# Patient Record
Sex: Female | Born: 1970 | Race: White | Hispanic: No | Marital: Married | Smoking: Never smoker
Health system: Southern US, Community
[De-identification: ages and names within clinical notes are randomized; demographics above are authoritative.]

## PROBLEM LIST (undated history)

## (undated) DIAGNOSIS — K929 Disease of digestive system, unspecified: Secondary | ICD-10-CM

## (undated) DIAGNOSIS — R928 Other abnormal and inconclusive findings on diagnostic imaging of breast: Secondary | ICD-10-CM

## (undated) DIAGNOSIS — I10 Essential (primary) hypertension: Secondary | ICD-10-CM

## (undated) DIAGNOSIS — K219 Gastro-esophageal reflux disease without esophagitis: Secondary | ICD-10-CM

## (undated) DIAGNOSIS — M722 Plantar fascial fibromatosis: Secondary | ICD-10-CM

## (undated) DIAGNOSIS — F419 Anxiety disorder, unspecified: Secondary | ICD-10-CM

## (undated) DIAGNOSIS — T7840XA Allergy, unspecified, initial encounter: Secondary | ICD-10-CM

## (undated) HISTORY — DX: Disease of digestive system, unspecified: K92.9

## (undated) HISTORY — PX: APPENDECTOMY: SHX54

## (undated) HISTORY — DX: Anxiety disorder, unspecified: F41.9

## (undated) HISTORY — DX: Essential (primary) hypertension: I10

## (undated) HISTORY — DX: Other abnormal and inconclusive findings on diagnostic imaging of breast: R92.8

## (undated) HISTORY — DX: Gastro-esophageal reflux disease without esophagitis: K21.9

## (undated) HISTORY — DX: Allergy, unspecified, initial encounter: T78.40XA

## (undated) HISTORY — PX: INTRAUTERINE DEVICE (IUD) INSERTION: SHX5877

## (undated) HISTORY — DX: Plantar fascial fibromatosis: M72.2

---

## 2006-01-11 ENCOUNTER — Ambulatory Visit: Payer: Self-pay | Admitting: Internal Medicine

## 2007-12-04 ENCOUNTER — Ambulatory Visit: Payer: Self-pay | Admitting: General Surgery

## 2007-12-13 ENCOUNTER — Ambulatory Visit: Payer: Self-pay | Admitting: General Surgery

## 2008-04-02 ENCOUNTER — Ambulatory Visit: Payer: Self-pay | Admitting: General Surgery

## 2008-12-04 ENCOUNTER — Ambulatory Visit: Payer: Self-pay | Admitting: General Surgery

## 2009-04-25 HISTORY — PX: FOOT SURGERY: SHX648

## 2009-12-07 ENCOUNTER — Ambulatory Visit: Payer: Self-pay | Admitting: General Surgery

## 2009-12-31 ENCOUNTER — Ambulatory Visit: Payer: Self-pay | Admitting: Podiatry

## 2010-12-09 ENCOUNTER — Ambulatory Visit: Payer: Self-pay | Admitting: General Surgery

## 2011-12-12 ENCOUNTER — Ambulatory Visit: Payer: Self-pay | Admitting: Obstetrics and Gynecology

## 2012-04-25 HISTORY — PX: CHOLECYSTECTOMY: SHX55

## 2012-10-16 ENCOUNTER — Ambulatory Visit: Payer: Self-pay | Admitting: Surgery

## 2012-10-17 LAB — PATHOLOGY REPORT

## 2012-12-13 ENCOUNTER — Ambulatory Visit: Payer: Self-pay | Admitting: Obstetrics and Gynecology

## 2014-01-06 ENCOUNTER — Ambulatory Visit: Payer: Self-pay | Admitting: Obstetrics and Gynecology

## 2014-08-15 NOTE — Op Note (Signed)
PATIENT NAME:  Mary Miranda, Mary Miranda MR#:  [id number] DATE OF BIRTH:  Jun 18, 1970  DATE OF PROCEDURE:  10/16/2012  PREOPERATIVE DIAGNOSIS: Chronic cholecystitis.   POSTOPERATIVE DIAGNOSIS: Chronic cholecystitis, cholelithiasis.   PROCEDURE: Laparoscopic cholecystectomy.   SURGEON: Renda RollsWilton Smith, MD  ANESTHESIA: General.   INDICATIONS: This 44 year old female has a history of epigastric pains. She had had ultrasound at another facility which reported a 2.4 cm polyp in the gallbladder and surgery was recommended for definitive treatment.   DESCRIPTION OF PROCEDURE: The patient was placed on the operating table in the supine position under general anesthesia. The abdomen was prepared with ChloraPrep and draped in a sterile manner. I noted her old scar in the right paramedian area.  An incision was made in the inferior aspect of the umbilicus and carried down to the deep fascia which was grasped with laryngeal hook and elevated. A Veress needle was inserted, aspirated and irrigated with a saline solution. Next, the peritoneal cavity was inflated with carbon dioxide. The Veress needle was removed. A 10 mm cannula was inserted. The 10 mm, 0 degree laparoscope was inserted to view the peritoneal cavity. Another incision was made in the epigastrium just to the right of the midline to introduce an 11 mm cannula. Another incision was made in the right upper quadrant to insert a 5 mm cannula and another just about 2 inches inferior to that to insert another 5 mm cannula.   A number of adhesions were noted between the omentum and the right anterior abdominal wall which appeared to be related to her previous appendicitis. Also, a number of adhesions in the right lower quadrant. The liver appeared normal. The gallbladder was retracted towards the right shoulder. There were multiple adhesions which were taken down from the gallbladder. The infundibulum was retracted inferiorly and laterally. The porta hepatis was  demonstrated. The cystic duct was dissected free from surrounding structures. The cystic artery was dissected free from surrounding structures. The neck of the gallbladder was mobilized with incision of the visceral peritoneum. A critical view of safety was demonstrated. Next, the cystic artery was controlled with double endoclips and divided which allowed better traction on the cystic duct. An Endo Clip was placed across the cystic duct adjacent to the neck of the gallbladder. An incision was made in the cystic duct to introduce a Reddick catheter. However, the catheter would not thread beyond about 6 mm and the balloon would not stay in.  Therefore, cholangiogram was not done. The Reddick catheter was removed. The cystic duct was doubly ligated with endoclips and divided. The gallbladder was dissected free from the liver with hook and cautery. There was very minimal bleeding. The site was irrigated with heparinized saline solution and aspirated. Hemostasis was subsequently intact. The gallbladder was completely separated and delivered out through the infraumbilical incision, opened and suctioned. There was a scant amount of sludge within the gallbladder and also identified several tiny approximately 1 mm stones adherent to the gallbladder wall.  The gallbladder was submitted in formalin for routine pathology. The right upper quadrant was further inspected. Hemostasis was intact. The cannulas were removed. Carbon dioxide was allowed to escape from the peritoneal cavity. Skin incisions were closed with interrupted 5-0 chromic subcuticular sutures, benzoin and Steri-Strips. Dressings were applied with paper tape. The patient tolerated surgery satisfactorily and was prepared for transfer to the recovery room. ____________________________ Shela CommonsJ. Renda RollsWilton Smith, MD jws:sb D: 10/16/2012 09:03:23 ET T: 10/16/2012 10:29:43 ET JOB#: [id number]  cc: Adella HareJ. Wilton Smith, MD, <  Dictator> Adella Hare MD ELECTRONICALLY SIGNED  10/17/2012 18:13

## 2014-09-25 ENCOUNTER — Other Ambulatory Visit: Payer: Self-pay | Admitting: Advanced Practice Midwife

## 2014-09-25 DIAGNOSIS — Z1231 Encounter for screening mammogram for malignant neoplasm of breast: Secondary | ICD-10-CM

## 2015-01-08 ENCOUNTER — Ambulatory Visit
Admission: RE | Admit: 2015-01-08 | Discharge: 2015-01-08 | Disposition: A | Discharge status: Home / Self Care | Payer: BLUE CROSS/BLUE SHIELD | Source: Ambulatory Visit | Attending: Advanced Practice Midwife | Admitting: Advanced Practice Midwife

## 2015-01-08 DIAGNOSIS — Z1231 Encounter for screening mammogram for malignant neoplasm of breast: Secondary | ICD-10-CM | POA: Insufficient documentation

## 2015-12-31 ENCOUNTER — Other Ambulatory Visit: Payer: Self-pay | Admitting: Advanced Practice Midwife

## 2015-12-31 DIAGNOSIS — Z1231 Encounter for screening mammogram for malignant neoplasm of breast: Secondary | ICD-10-CM

## 2016-01-12 ENCOUNTER — Ambulatory Visit: Payer: BLUE CROSS/BLUE SHIELD

## 2016-02-03 ENCOUNTER — Ambulatory Visit
Admission: RE | Admit: 2016-02-03 | Discharge: 2016-02-03 | Disposition: A | Discharge status: Home / Self Care | Payer: BLUE CROSS/BLUE SHIELD | Source: Ambulatory Visit | Attending: Advanced Practice Midwife | Admitting: Advanced Practice Midwife

## 2016-02-03 DIAGNOSIS — Z1231 Encounter for screening mammogram for malignant neoplasm of breast: Secondary | ICD-10-CM | POA: Insufficient documentation

## 2016-02-05 ENCOUNTER — Other Ambulatory Visit: Payer: Self-pay | Admitting: Advanced Practice Midwife

## 2016-02-05 DIAGNOSIS — N6320 Unspecified lump in the left breast, unspecified quadrant: Secondary | ICD-10-CM

## 2016-02-09 ENCOUNTER — Ambulatory Visit
Admission: RE | Admit: 2016-02-09 | Discharge: 2016-02-09 | Disposition: A | Discharge status: Home / Self Care | Payer: BLUE CROSS/BLUE SHIELD | Source: Ambulatory Visit | Attending: Advanced Practice Midwife | Admitting: Advanced Practice Midwife

## 2016-02-09 DIAGNOSIS — N6320 Unspecified lump in the left breast, unspecified quadrant: Secondary | ICD-10-CM | POA: Insufficient documentation

## 2016-06-22 ENCOUNTER — Telehealth: Payer: Self-pay | Admitting: Advanced Practice Midwife

## 2016-06-22 NOTE — Telephone Encounter (Signed)
Patient called in stating she recieved a bill from [facility] and is being charged for labs that came with annual. Patient called [facility] and they said it was a coding issue on our side. Please contact the patient as she has had the run around about this bill.

## 2016-06-23 NOTE — Telephone Encounter (Signed)
Viewed chart/coding for labs and printed Note in [facility]. It appears that the MAP ALL button was used when labs were processed which could have caused incorrect mapping of diagnosis to specific labs. [facility] visit note printed and proper diagnosis written beside each lab ordered. Will contact [facility] for resubmission with proper codes to each lab.

## 2016-06-24 NOTE — Telephone Encounter (Signed)
Spoke with pt. Notified that I will contact [facility] to give correct diagnosis codes for each lab and have them resubmit.

## 2016-06-30 DIAGNOSIS — L0293 Carbuncle, unspecified: Secondary | ICD-10-CM | POA: Diagnosis not present

## 2016-08-18 DIAGNOSIS — L02419 Cutaneous abscess of limb, unspecified: Secondary | ICD-10-CM | POA: Diagnosis not present

## 2016-08-29 DIAGNOSIS — I871 Compression of vein: Secondary | ICD-10-CM | POA: Diagnosis not present

## 2016-09-06 ENCOUNTER — Encounter (INDEPENDENT_AMBULATORY_CARE_PROVIDER_SITE_OTHER): Payer: Self-pay | Admitting: Vascular Surgery

## 2016-09-06 ENCOUNTER — Ambulatory Visit (INDEPENDENT_AMBULATORY_CARE_PROVIDER_SITE_OTHER): Payer: BLUE CROSS/BLUE SHIELD | Admitting: Vascular Surgery

## 2016-09-06 VITALS — BP 157/97 | HR 82 | Resp 16 | Ht 65.5 in | Wt 169.0 lb

## 2016-09-06 DIAGNOSIS — M79609 Pain in unspecified limb: Secondary | ICD-10-CM | POA: Insufficient documentation

## 2016-09-06 DIAGNOSIS — I808 Phlebitis and thrombophlebitis of other sites: Secondary | ICD-10-CM | POA: Insufficient documentation

## 2016-09-06 DIAGNOSIS — M79602 Pain in left arm: Secondary | ICD-10-CM

## 2016-09-06 NOTE — Assessment & Plan Note (Signed)
The patient has a painful cord on the medial aspect of the left upper extremity correlating with a basilic vein thrombophlebitis. We had a discussion today regarding the generally benign nature of these conditions. The area may take many more weeks or even months to heal completely. The risk of embolization or propagation is very small at this point. Would recommend an aspirin daily. I would recommend warm compresses for symptomatic relief. Given the long-standing nature of the lesion, ultrasound is probably not really that helpful at this point. If she developed significant arm swelling consideration for a compression sleeve should be given as well.

## 2016-09-06 NOTE — Progress Notes (Signed)
Patient ID: Mary Miranda, female   DOB: 1970-06-11, 46 y.o.   MRN: [id number]  Chief Complaint  Patient presents with  . New Evaluation    Vein obstruction    HPI Mary Miranda is a 46 y.o. female.  I am asked to see the patient by Dr. Dan Humphreys for evaluation of a persistent painful cord in her left arm.  The patient reports about 2 months ago she developed an axillary abscess on the left. This was drained. Following this she developed a firm cord on the inner aspect of her left upper arm. This has slowly gone towards the antecubital fossa over time. Warm compresses have helped this. There is occasional swelling. The pain is most severe in the evening when she has been using her arm for much of the day. She denies any chest pain or shortness of breath. She did not have an IV in that arm.   Past Medical History:  Diagnosis Date  . Allergy   . Anxiety     Past Surgical History:  Procedure Laterality Date  . APPENDECTOMY    . CHOLECYSTECTOMY      Family History  Problem Relation Age of Onset  . Breast cancer Neg Hx    No bleeding or clotting disorders  Social History Social History  Substance Use Topics  . Smoking status: Never Smoker  . Smokeless tobacco: Never Used  . Alcohol use Yes     Comment: ocassionally     Allergies  Allergen Reactions  . Amoxicillin-Pot Clavulanate     Other reaction(s): Other (See Comments) GI upset  . Codeine Itching    Current Outpatient Prescriptions  Medication Sig Dispense Refill  . cetirizine (ZYRTEC) 10 MG tablet Take by mouth.    . Cholecalciferol (VITAMIN D3) 1000 units CAPS Take by mouth.    . fluticasone (FLONASE) 50 MCG/ACT nasal spray PLACE 2 SPRAYS INTO BOTH NOSTRILS ONCE DAILY.  3  . MAGNESIUM PO Take by mouth.    . meloxicam (MOBIC) 7.5 MG tablet Take by mouth.    Marland Kitchen omeprazole (PRILOSEC) 20 MG capsule   2  . zolpidem (AMBIEN) 5 MG tablet TAKE 1 TABLET BY MOUTH NIGHTLY AS NEEDED FOR SLEEP     No current  facility-administered medications for this visit.       REVIEW OF SYSTEMS (Negative unless checked)  Constitutional: [] Weight loss  [] Fever  [] Chills Cardiac: [] Chest pain   [] Chest pressure   [] Palpitations   [] Shortness of breath when laying flat   [] Shortness of breath at rest   [] Shortness of breath with exertion. Vascular:  [] Pain in legs with walking   [] Pain in legs at rest   [] Pain in legs when laying flat   [] Claudication   [] Pain in feet when walking  [] Pain in feet at rest  [] Pain in feet when laying flat   [] History of DVT   [] Phlebitis   [] Swelling in legs   [] Varicose veins   [] Non-healing ulcers Pulmonary:   [] Uses home oxygen   [] Productive cough   [] Hemoptysis   [] Wheeze  [] COPD   [] Asthma Neurologic:  [] Dizziness  [] Blackouts   [] Seizures   [] History of stroke   [] History of TIA  [] Aphasia   [] Temporary blindness   [] Dysphagia   [] Weakness or numbness in arms   [] Weakness or numbness in legs Musculoskeletal:  [] Arthritis   [] Joint swelling   [] Joint pain   [] Low back pain Hematologic:  [] Easy bruising  [] Easy bleeding   [] Hypercoagulable  state   [] Anemic  [] Hepatitis Gastrointestinal:  [] Blood in stool   [] Vomiting blood  [] Gastroesophageal reflux/heartburn   [] Abdominal pain Genitourinary:  [] Chronic kidney disease   [] Difficult urination  [] Frequent urination  [] Burning with urination   [] Hematuria Skin:  [] Rashes   [] Ulcers   [] Wounds Psychological:  [x] History of anxiety   []  History of major depression.    Physical Exam BP (!) 157/97 (BP Location: Right Arm)   Pulse 82   Resp 16   Ht 5' 5.5" (1.664 m)   Wt 169 lb (76.7 kg)   BMI 27.70 kg/m  Gen:  WD/WN, NAD Head: [place]/AT, No temporalis wasting. Ear/Nose/Throat: Hearing grossly intact, nares w/o erythema or drainage, oropharynx w/o Erythema/Exudate Eyes: Conjunctiva clear, sclera non-icteric  Neck: trachea midline.  No JVD.  Pulmonary:  Good air movement, no use of accessory muscles Cardiac: RRR Vascular:    Vessel Right Left  Radial Palpable Palpable                                   Gastrointestinal: soft, non-tender/non-distended.  Musculoskeletal: M/S 5/5 throughout.  Extremities without ischemic changes.  No deformity or atrophy. Firm, palpable cord on the medial aspect of the left upper arm correlating clinically with the location of the basilic vein. There is no surrounding erythema. It is minimally tender. Neurologic: Sensation grossly intact in extremities.  Symmetrical.  Speech is fluent. Motor exam as listed above. Psychiatric: Judgment intact, Mood & affect appropriate for pt's clinical situation. Dermatologic: No rashes or ulcers noted.  No cellulitis or open wounds. Lymph : No Cervical, Axillary, or Inguinal lymphadenopathy.   Radiology No results found.  Labs No results found for this or any previous visit (from the past 2160 hour(s)).  Assessment/Plan:  Pain in limb The patient has a painful cord on the medial aspect of the left upper extremity correlating with a basilic vein thrombophlebitis. We had a discussion today regarding the generally benign nature of these conditions. The area may take many more weeks or even months to heal completely. The risk of embolization or propagation is very small at this point. Would recommend an aspirin daily. I would recommend warm compresses for symptomatic relief. Given the long-standing nature of the lesion, ultrasound is probably not really that helpful at this point. If she developed significant arm swelling consideration for a compression sleeve should be given as well.  Superficial thrombophlebitis of left upper extremity The patient has a painful cord on the medial aspect of the left upper extremity correlating with a basilic vein thrombophlebitis. We had a discussion today regarding the generally benign nature of these conditions. The area may take many more weeks or even months to heal completely. The risk of embolization or  propagation is very small at this point. Would recommend an aspirin daily. I would recommend warm compresses for symptomatic relief. Given the long-standing nature of the lesion, ultrasound is probably not really that helpful at this point. If she developed significant arm swelling consideration for a compression sleeve should be given as well.      Festus BarrenJason Lanyia Jewel 09/06/2016, 9:55 AM   This note was created with Dragon medical transcription system.  Any errors from dictation are unintentional.

## 2016-09-06 NOTE — Assessment & Plan Note (Signed)
The patient has a painful cord on the medial aspect of the left upper extremity correlating with a basilic vein thrombophlebitis. We had a discussion today regarding the generally benign nature of these conditions. The area may take many more weeks or even months to heal completely. The risk of embolization or propagation is very small at this point. Would recommend an aspirin daily. I would recommend warm compresses for symptomatic relief. Given the long-standing nature of the lesion, ultrasound is probably not really that helpful at this point. If she developed significant arm swelling consideration for a compression sleeve should be given as well. 

## 2016-09-15 ENCOUNTER — Encounter (INDEPENDENT_AMBULATORY_CARE_PROVIDER_SITE_OTHER): Payer: Self-pay | Admitting: Vascular Surgery

## 2016-09-27 DIAGNOSIS — J042 Acute laryngotracheitis: Secondary | ICD-10-CM | POA: Diagnosis not present

## 2016-09-30 ENCOUNTER — Telehealth (INDEPENDENT_AMBULATORY_CARE_PROVIDER_SITE_OTHER): Payer: Self-pay | Admitting: Vascular Surgery

## 2016-09-30 NOTE — Telephone Encounter (Signed)
Leg has stopped hurting  But now it is hurting again and she is wondering if she should coming. She states she did some lifting and doesn't know if she shouldn't have but wants to know if there is any advice for her to get back to not having pain as she had done before. Please advise.

## 2016-09-30 NOTE — Telephone Encounter (Signed)
Called the patient to inform her of what to do regarding the pain that she is having.

## 2017-02-10 DIAGNOSIS — Z23 Encounter for immunization: Secondary | ICD-10-CM | POA: Diagnosis not present

## 2017-02-13 ENCOUNTER — Ambulatory Visit: Payer: BLUE CROSS/BLUE SHIELD | Admitting: Certified Nurse Midwife

## 2017-02-15 ENCOUNTER — Other Ambulatory Visit: Payer: Self-pay | Admitting: Certified Nurse Midwife

## 2017-02-15 ENCOUNTER — Encounter: Payer: Self-pay | Admitting: Certified Nurse Midwife

## 2017-02-15 ENCOUNTER — Ambulatory Visit (INDEPENDENT_AMBULATORY_CARE_PROVIDER_SITE_OTHER): Payer: BLUE CROSS/BLUE SHIELD | Admitting: Certified Nurse Midwife

## 2017-02-15 VITALS — BP 122/76 | HR 77 | Ht 65.0 in | Wt 172.0 lb

## 2017-02-15 DIAGNOSIS — B373 Candidiasis of vulva and vagina: Secondary | ICD-10-CM

## 2017-02-15 DIAGNOSIS — Z1231 Encounter for screening mammogram for malignant neoplasm of breast: Secondary | ICD-10-CM | POA: Diagnosis not present

## 2017-02-15 DIAGNOSIS — L292 Pruritus vulvae: Secondary | ICD-10-CM

## 2017-02-15 DIAGNOSIS — Z1239 Encounter for other screening for malignant neoplasm of breast: Secondary | ICD-10-CM

## 2017-02-15 DIAGNOSIS — B3731 Acute candidiasis of vulva and vagina: Secondary | ICD-10-CM

## 2017-02-15 DIAGNOSIS — Z01419 Encounter for gynecological examination (general) (routine) without abnormal findings: Secondary | ICD-10-CM | POA: Diagnosis not present

## 2017-02-15 LAB — POCT WET PREP (WET MOUNT): TRICHOMONAS WET PREP HPF POC: ABSENT

## 2017-02-15 MED ORDER — TRIAMCINOLONE ACETONIDE 0.1 % EX OINT: 1.0000 "application " | TOPICAL_OINTMENT | Freq: Two times a day (BID) | CUTANEOUS | 0 refills | Status: DC

## 2017-02-15 MED ORDER — NYSTATIN 100000 UNIT/GM EX OINT: 1.0000 "application " | TOPICAL_OINTMENT | Freq: Two times a day (BID) | CUTANEOUS | 0 refills | Status: DC

## 2017-02-15 MED ORDER — FLUCONAZOLE 150 MG PO TABS: ORAL_TABLET | ORAL | 0 refills | Status: DC

## 2017-02-15 NOTE — Progress Notes (Signed)
Gynecology Annual Exam  PCP: Rafael Bihari, MD  Chief Complaint: No chief complaint on file.   History of Present Illness:Mary Miranda presents today for her annual gyn exam. She is a 46 year old Caucasian/White female, G1 P1001, whose LMP was 01/17/2017.  She has the following complaints: recurrent premenstrual vulvar itching for many months. Vagisil and other "antiitch creams" have not helped. Itching resolves after menses.  Her menses are monthly.  They last 6-8 days, are medium flow, and are without clots. She reports dysmenorrhea which responds to Advil and a heating pad. She will occasionally have spotting midcycle.  The patient's past medical history is remarkable for fibrocystic changes on mammogram and GERD. Since her last annual exam on 02/11/2016, she was diagnosed with superficial thrombophlebitis of her upper left arm following an axillary abscess. She is currently taking a baby ASA daily. She is sexually active. She is currently using a Paraguard IUD for contraception. It was replaced 05/2015. She has been with current partner for 25+ years.  She does not have concerns about sex. She does not request STD testing.  Her most recent pap smear was obtained 02/11/2016 and was NIL/neg HRHPV. There is no history of abnormal Pap smears. Her most recent mammogram obtained on 02/09/2016 was Bir[facility] 0. Diagnostic views and F/u US showed normal cysts. There is no family history of breast cancer. There is no family history of ovarian cancer. The patient does do occasional self breast exams.  The patient does not smoke.  The patient does drink infrequently.  The patient does not use illegal drugs.  The patient exercises occasionally by walking.  The patient does not get adequate calcium in her diet. The patient does not take take a multivitamin. She does take vitaminD3 1000 IU daily. She had a recent cholesterol screen in 2017 that was normal.     The patient denies current  symptoms of depression.    Review of Systems: ROS  Past Medical History:  Past Medical History:  Diagnosis Date  . Allergy   . Anxiety     Past Surgical History:  Past Surgical History:  Procedure Laterality Date  . APPENDECTOMY    . CHOLECYSTECTOMY      Family History:  Family History  Problem Relation Age of Onset  . Breast cancer Neg Hx     Social History:  Social History   Social History  . Marital status: Married    Spouse name: N/A  . Number of children: N/A  . Years of education: N/A   Occupational History  . Not on file.   Social History Main Topics  . Smoking status: Never Smoker  . Smokeless tobacco: Never Used  . Alcohol use Yes     Comment: ocassionally  . Drug use: No  . Sexual activity: Not on file   Other Topics Concern  . Not on file   Social History Narrative  . No narrative on file    Allergies:  Allergies  Allergen Reactions  . Amoxicillin-Pot Clavulanate     Other reaction(s): Other (See Comments) GI upset  . Codeine Itching   Medication:  Current Outpatient Prescriptions on File Prior to Visit  Medication Sig Dispense Refill  . cetirizine (ZYRTEC) 10 MG tablet Take by mouth.    . Cholecalciferol (VITAMIN D3) 1000 units CAPS Take by mouth.    . fluticasone (FLONASE) 50 MCG/ACT nasal spray PLACE 2 SPRAYS INTO BOTH NOSTRILS ONCE DAILY.  3  .  meloxicam (MOBIC) 7.5 MG tablet Take by mouth.    Marland Kitchen. omeprazole (PRILOSEC) 20 MG capsule   2  . zolpidem (AMBIEN) 5 MG tablet TAKE 1 TABLET BY MOUTH NIGHTLY AS NEEDED FOR SLEEP     No current facility-administered medications on file prior to visit.   and a Baby ASA daily  Physical Exam:  Vitals: BP 122/76   Pulse 77   Ht 5\' 5"  (1.651 m)   Wt 172 lb (78 kg)   LMP 01/17/2017 (Approximate)   BMI 28.62 kg/m   General: WF in NAD HEENT: normocephalic, anicteric Neck: no thyroid enlargement, no palpable nodules, no cervical lymphadenopathy  Pulmonary: No increased work of breathing,  CTAB Cardiovascular: RRR, without murmur  Breast: Breast symmetrical, no tenderness, no palpable nodules or masses, no skin or nipple retraction present, no nipple discharge.  No axillary, infraclavicular or supraclavicular lymphadenopathy. Abdomen: Soft, non-tender, non-distended.  Umbilicus without lesions.  No hepatomegaly or masses palpable. No evidence of hernia. Genitourinary:  External: Mildly inflamed vestibule. Normal urethral meatus, normal Bartholin's and Skene's glands.    Vagina: white discharge, no evidence of prolapse.    Cervix: Grossly normal in appearance, no bleeding, non-tender, IUD strings at cervical os  Uterus: Anteflexed, normal size, shape, and consistency, mobile, and non-tender  Adnexa: No adnexal masses, non-tender  Rectal: deferred  Lymphatic: no evidence of inguinal lymphadenopathy Extremities: no edema, erythema, or tenderness Neurologic: Grossly intact Psychiatric: mood appropriate, affect full  Results for orders placed or performed in visit on 02/15/17 (from the past 24 hour(s))  POCT Wet Prep Mellody Drown([place])     Status: Abnormal   Collection Time: 02/15/17  4:42 PM  Result Value Ref Range   Source Wet Prep POC VAGINAL    WBC, Wet Prep HPF POC     Bacteria Wet Prep HPF POC  Few   BACTERIA WET PREP MORPHOLOGY POC     Clue Cells Wet Prep HPF POC None None   Clue Cells Wet Prep Whiff POC     Yeast Wet Prep HPF POC Many    KOH Wet Prep POC     Trichomonas Wet Prep HPF POC Absent Absent     Assessment/ Plan: 46 y.o. annual gyn exam IUD in place Monilial vulvovaginitis  Diflucan 150 mgm every 3 days x 2 doses  Nystatin and Kenalog 0.1% ointment-mix and apply to vulva 2-3 times a day for itching  1) Breast cancer screening - recommend monthly self breast exam and annual screening mammograms. Mammogram was ordered today. [facility]  2) Cervical cancer screening - Pap not done. ASCCP guidelines and rational discussed.   Patient opts for every 3 years screening interval  3)Routine healthcare maintenance including cholesterol and diabetes screening UTD   4) RTO 1 year and prn   Farrel Connersolleen Vaneta [place]ree, CNM

## 2017-02-15 NOTE — Patient Instructions (Addendum)
Bone Health Bones protect organs, store calcium, and anchor muscles. Good health habits, such as eating nutritious foods and exercising regularly, are important for maintaining healthy bones. They can also help to prevent a condition that causes bones to lose density and become weak and brittle (osteoporosis). Why is bone mass important? Bone mass refers to the amount of bone tissue that you have. The higher your bone mass, the stronger your bones. An important step toward having healthy bones throughout life is to have strong and dense bones during childhood. A young adult who has a high bone mass is more likely to have a high bone mass later in life. Bone mass at its greatest it is called peak bone mass. A large decline in bone mass occurs in older adults. In women, it occurs about the time of menopause. During this time, it is important to practice good health habits, because if more bone is lost than what is replaced, the bones will become less healthy and more likely to break (fracture). If you find that you have a low bone mass, you may be able to prevent osteoporosis or further bone loss by changing your diet and lifestyle. How can I find out if my bone mass is low? Bone mass can be measured with an X-ray test that is called a bone mineral density (BMD) test. This test is recommended for all women who are age 46 or older. It may also be recommended for men who are age 30570 or older, or for people who are more likely to develop osteoporosis due to:  Having bones that break easily.  Having a long-term disease that weakens bones, such as kidney disease or rheumatoid arthritis.  Having menopause earlier than normal.  Taking medicine that weakens bones, such as steroids, thyroid hormones, or hormone treatment for breast cancer or prostate cancer.  Smoking.  Drinking three or more alcoholic drinks each day.  What are the nutritional recommendations for healthy bones? To have healthy bones, you  need to get enough of the right minerals and vitamins. Most nutrition experts recommend getting these nutrients from the foods that you eat. Nutritional recommendations vary from person to person. Ask your health care provider what is healthy for you. Here are some general guidelines. Calcium Recommendations Calcium is the most important (essential) mineral for bone health. Most people can get enough calcium from their diet, but supplements may be recommended for people who are at risk for osteoporosis. Good sources of calcium include:  Dairy products, such as low-fat or nonfat milk, cheese, and yogurt.  Dark green leafy vegetables, such as bok choy and broccoli, spinach, mustard greens, collard greens  Calcium-fortified foods, such as orange juice, cereal, bread, soy beverages, and tofu products.  Nuts, such as almonds. Calcium citrate or Citracal is a good calcium supplement. Follow these recommended amounts for daily calcium intake:  Children, age 67?3: 700 mg.  Children, age 30?8: 1,000 mg.  Children, age 27?13: 1,300 mg.  Teens, age 674?18: 1,300 mg.  Adults, age 679?50: 1,000 mg.  Adults, age 12?70: ? Men: 1,000 mg. ? Women: 1,200 mg.  Adults, age 46 or older: 1,200 mg.  Pregnant and breastfeeding females: ? Teens: 1,300 mg. ? Adults: 1,000 mg.  Vitamin D Recommendations Vitamin D is the most essential vitamin for bone health. It helps the body to absorb calcium. Sunlight stimulates the skin to make vitamin D, so be sure to get enough sunlight. If you live in a cold climate or you do not  get outside often, your health care provider may recommend that you take vitamin D supplements. Good sources of vitamin D in your diet include:  Egg yolks.  Saltwater fish.  Milk and cereal fortified with vitamin D.  Follow these recommended amounts for daily vitamin D intake:  Children and teens, age 94?18: 600 international units.  Adults, age 24 or younger: 400-800 international  units.  Adults, age 80 or older: 800-1,000 international units.  Other Nutrients Other nutrients for bone health include:  Phosphorus. This mineral is found in meat, poultry, dairy foods, nuts, and legumes. The recommended daily intake for adult men and adult women is 700 mg.  Magnesium. This mineral is found in seeds, nuts, dark green vegetables, and legumes. The recommended daily intake for adult men is 400?420 mg. For adult women, it is 310?320 mg.  Vitamin K. This vitamin is found in green leafy vegetables. The recommended daily intake is 120 mg for adult men and 90 mg for adult women.  What type of physical activity is best for building and maintaining healthy bones? Weight-bearing and strength-building activities are important for building and maintaining peak bone mass. Weight-bearing activities cause muscles and bones to work against gravity. Strength-building activities increases muscle strength that supports bones. Weight-bearing and muscle-building activities include:  Walking and hiking.  Jogging and running.  Dancing.  Gym exercises.  Lifting weights.  Tennis and racquetball.  Climbing stairs.  Aerobics.  Adults should get at least 30 minutes of moderate physical activity on most days. Children should get at least 60 minutes of moderate physical activity on most days. Ask your health care provide what type of exercise is best for you. Where can I find more information? For more information, check out the following websites:  National Osteoporosis Foundation: http://burton-owens.org/  [employer]: http://www.niams.http://www.johnson-fowler.biz/.asp  This information is not intended to replace advice given to you by your health care provider. Make sure you discuss any questions you have with your health care provider. Document Released: 07/02/2003 Document Revised: 10/30/2015 Document Reviewed: 04/16/2014 Elsevier  Interactive Patient Education  [employer].

## 2017-03-01 ENCOUNTER — Ambulatory Visit
Admission: RE | Admit: 2017-03-01 | Discharge: 2017-03-01 | Disposition: A | Discharge status: Home / Self Care | Payer: BLUE CROSS/BLUE SHIELD | Source: Ambulatory Visit | Attending: Certified Nurse Midwife | Admitting: Certified Nurse Midwife

## 2017-03-01 DIAGNOSIS — Z1231 Encounter for screening mammogram for malignant neoplasm of breast: Secondary | ICD-10-CM | POA: Diagnosis not present

## 2017-03-27 DIAGNOSIS — L03114 Cellulitis of left upper limb: Secondary | ICD-10-CM | POA: Diagnosis not present

## 2017-03-27 DIAGNOSIS — J329 Chronic sinusitis, unspecified: Secondary | ICD-10-CM | POA: Diagnosis not present

## 2017-05-01 DIAGNOSIS — H524 Presbyopia: Secondary | ICD-10-CM | POA: Diagnosis not present

## 2017-07-07 DIAGNOSIS — M5412 Radiculopathy, cervical region: Secondary | ICD-10-CM | POA: Diagnosis not present

## 2017-07-07 DIAGNOSIS — J329 Chronic sinusitis, unspecified: Secondary | ICD-10-CM | POA: Diagnosis not present

## 2017-08-21 ENCOUNTER — Other Ambulatory Visit: Payer: Self-pay | Admitting: Certified Nurse Midwife

## 2017-08-21 DIAGNOSIS — Z1231 Encounter for screening mammogram for malignant neoplasm of breast: Secondary | ICD-10-CM

## 2017-09-22 DIAGNOSIS — M25551 Pain in right hip: Secondary | ICD-10-CM | POA: Diagnosis not present

## 2017-09-22 DIAGNOSIS — M545 Low back pain: Secondary | ICD-10-CM | POA: Diagnosis not present

## 2017-10-05 DIAGNOSIS — M5412 Radiculopathy, cervical region: Secondary | ICD-10-CM | POA: Diagnosis not present

## 2017-10-05 DIAGNOSIS — M542 Cervicalgia: Secondary | ICD-10-CM | POA: Diagnosis not present

## 2017-11-12 DIAGNOSIS — J019 Acute sinusitis, unspecified: Secondary | ICD-10-CM | POA: Diagnosis not present

## 2017-11-12 DIAGNOSIS — B9689 Other specified bacterial agents as the cause of diseases classified elsewhere: Secondary | ICD-10-CM | POA: Diagnosis not present

## 2017-11-12 DIAGNOSIS — B373 Candidiasis of vulva and vagina: Secondary | ICD-10-CM | POA: Diagnosis not present

## 2017-12-19 DIAGNOSIS — J0191 Acute recurrent sinusitis, unspecified: Secondary | ICD-10-CM | POA: Diagnosis not present

## 2018-02-16 ENCOUNTER — Encounter: Payer: Self-pay | Admitting: Certified Nurse Midwife

## 2018-02-16 ENCOUNTER — Ambulatory Visit (INDEPENDENT_AMBULATORY_CARE_PROVIDER_SITE_OTHER): Payer: BLUE CROSS/BLUE SHIELD | Admitting: Certified Nurse Midwife

## 2018-02-16 VITALS — BP 100/70 | HR 78 | Ht 65.5 in | Wt 171.0 lb

## 2018-02-16 DIAGNOSIS — Z01419 Encounter for gynecological examination (general) (routine) without abnormal findings: Secondary | ICD-10-CM | POA: Diagnosis not present

## 2018-02-16 DIAGNOSIS — B9689 Other specified bacterial agents as the cause of diseases classified elsewhere: Secondary | ICD-10-CM

## 2018-02-16 DIAGNOSIS — Z1239 Encounter for other screening for malignant neoplasm of breast: Secondary | ICD-10-CM

## 2018-02-16 DIAGNOSIS — L292 Pruritus vulvae: Secondary | ICD-10-CM

## 2018-02-16 DIAGNOSIS — Z131 Encounter for screening for diabetes mellitus: Secondary | ICD-10-CM

## 2018-02-16 DIAGNOSIS — N760 Acute vaginitis: Secondary | ICD-10-CM | POA: Diagnosis not present

## 2018-02-16 MED ORDER — NYSTATIN 100000 UNIT/GM EX OINT: 1.0000 "application " | TOPICAL_OINTMENT | Freq: Two times a day (BID) | CUTANEOUS | 0 refills | Status: AC

## 2018-02-16 MED ORDER — TRIAMCINOLONE ACETONIDE 0.1 % EX OINT: 1.0000 "application " | TOPICAL_OINTMENT | Freq: Two times a day (BID) | CUTANEOUS | 0 refills | Status: DC

## 2018-02-16 MED ORDER — FLUCONAZOLE 150 MG PO TABS: ORAL_TABLET | ORAL | 0 refills | Status: DC

## 2018-02-16 NOTE — Progress Notes (Signed)
Gynecology Annual Exam  PCP: Rafael Bihari, MD  Chief Complaint:  Chief Complaint  Patient presents with  . Gynecologic Exam    recurring yeast inf; body aches around time of period    History of Present Illness:Mary Miranda presents today for her annual gyn exam. She is a 47 year old Caucasian/White female, G1 P1001, whose LMP was 01/23/2018.  She has the following complaints: recurrent monthly yeast infections around the time of her menses for the last 2 years. She has chronic sinus infections and also gets yeast infection symptoms when taking the antibiotics. She was treated for a yeast infection at her last annual appointment. She has been using 3 day OTC creams to treat her symptoms-which decreases the symptoms, but the symptoms worsen again around the time of her menses.  Her menses are monthly.  They last 5-7 days, sometimes with a heavier flow x 1-2 days requiring pad changes every 2 hours, and are without clots. She reports dysmenorrhea which responds to Advil or Tylenol. She will occasionally have spotting midcycle.  The patient's past medical history is remarkable for fibrocystic changes on mammogram, anxiety, insomnia,  and GERD. Last year she was diagnosed with superficial thrombophlebitis of her upper left arm following an axillary abscess. She occasionally has some transient pain in the left axillary area.  Since her last annual exam 02/15/2017, she has changed jobs and is working in the evening and is not as active during the day. She has started having hot flashes during the day. She also reports feeling tired and having body aches during her menses and just after her menses.   She is sexually active. She is currently using a Paraguard IUD for contraception. It was replaced 05/2015. She has been with current partner for 25+ years.  She does not have concerns about sex. She does not request STD testing.  Her most recent pap smear was obtained 02/11/2016 and was  NIL/neg HRHPV. There is no history of abnormal Pap smears. Her most recent mammogram obtained on 03/01/2017 was [facility].There is no family history of breast cancer. There is no family history of ovarian cancer. The patient does do self breast exams.  The patient does not smoke.  The patient does drink occasionally.  The patient does not use illegal drugs.  The patient exercises occasionally by walking. Has been trying to get into an exercise routine The patient does not get adequate calcium in her diet.  She does take calcium supplements and  vitaminD3 1000 IU daily. She had a recent cholesterol screen in 2017 that was normal.   The patient denies current symptoms of depression.    Review of Systems: Review of Systems  Constitutional: Positive for malaise/fatigue. Negative for chills, fever and weight loss.  HENT: Positive for congestion and sore throat. Negative for sinus pain.   Eyes: Negative for blurred vision and pain.  Respiratory: Negative for hemoptysis, shortness of breath and wheezing.   Cardiovascular: Negative for chest pain, palpitations and leg swelling.  Gastrointestinal: Positive for constipation. Negative for abdominal pain, blood in stool, diarrhea, heartburn, nausea and vomiting.  Genitourinary: Negative for dysuria, frequency, hematuria and urgency.       Has had occasional intermenstrual spotting earlier in the year. Difficulty holding urine  Musculoskeletal: Negative for back pain, joint pain and myalgias.  Skin: Negative for itching and rash.  Neurological: Negative for dizziness, tingling and headaches.  Endo/Heme/Allergies: Positive for environmental allergies. Negative for polydipsia. Does  not bruise/bleed easily.       Negative for hirsutism. Positive for hot flashes   Psychiatric/Behavioral: Negative for depression. The patient is nervous/anxious and has insomnia.    Breasts: Breast tenderness left axillary area  Past Medical History:  Past Medical History:    Diagnosis Date  . Abnormal mammogram    Dr. Evette Cristal aware and watching  . Allergy   . Anxiety   . Gastrointestinal disorder   . GERD (gastroesophageal reflux disease)   . Plantar fasciitis     Past Surgical History:  Past Surgical History:  Procedure Laterality Date  . APPENDECTOMY    . CHOLECYSTECTOMY  2014   laparascopic  . FOOT SURGERY  2011   pantar fasciitis  . INTRAUTERINE DEVICE (IUD) INSERTION  08/2006, 05/2015   paragard    Family History:  Family History  Problem Relation Age of Onset  . Endometriosis Mother   . Heart disease Mother   . Thyroid disease Mother        hyperthyroidism  . Hyperlipidemia Father   . Hypertension Father   . Prostate cancer Father 31  . Hyperlipidemia Brother   . Hypertension Brother   . Uterine cancer Maternal Grandmother 50       early 13s  . Colon cancer Other 60       x4  . Colon cancer Other   . Breast cancer Neg Hx     Social History:  Social History   Socioeconomic History  . Marital status: Married    Spouse name: Not on file  . Number of children: 1  . Years of education: 72  . Highest education level: Not on file  Occupational History  . Occupation: Psychologist, counselling    Comment: assisted living  Social Needs  . Financial resource strain: Not on file  . Food insecurity:    Worry: Not on file    Inability: Not on file  . Transportation needs:    Medical: Not on file    Non-medical: Not on file  Tobacco Use  . Smoking status: Never Smoker  . Smokeless tobacco: Never Used  Substance and Sexual Activity  . Alcohol use: Yes    Comment: ocassionally  . Drug use: No  . Sexual activity: Yes    Birth control/protection: IUD  Lifestyle  . Physical activity:    Days per week: Not on file    Minutes per session: Not on file  . Stress: Not on file  Relationships  . Social connections:    Talks on phone: Not on file    Gets together: Not on file    Attends religious service: Not on file    Active member of  club or organization: Not on file    Attends meetings of clubs or organizations: Not on file    Relationship status: Not on file  . Intimate partner violence:    Fear of current or ex partner: Not on file    Emotionally abused: Not on file    Physically abused: Not on file    Forced sexual activity: Not on file  Other Topics Concern  . Not on file  Social History Narrative  . Not on file    Allergies:  Allergies  Allergen Reactions  . Amoxicillin-Pot Clavulanate     Other reaction(s): Other (See Comments) GI upset  . Codeine Itching   Medication:  Current Outpatient Medications on File Prior to Visit  Medication Sig Dispense Refill  . acetaminophen (TYLENOL) 325 MG  tablet Take 2 tablets by mouth as needed.    Marland Kitchen azelastine (ASTELIN) 0.1 % nasal spray Place 1 spray into the nose as needed.    . Biotin 1000 MCG tablet Take 2,000 mcg by mouth daily.    . calcium-vitamin D 250-100 MG-UNIT tablet Take 1 tablet by mouth daily.    . cetirizine (ZYRTEC) 10 MG tablet Take by mouth.    . Cholecalciferol (VITAMIN D3) 1000 units CAPS Take by mouth.    . Docosahexaenoic Acid (DHA COMPLETE) 200 MG CAPS Take 1 capsule by mouth daily.    Marland Kitchen docusate sodium (COLACE) 100 MG capsule Take 100 mg by mouth as needed for mild constipation.    . fluticasone (FLONASE) 50 MCG/ACT nasal spray PLACE 2 SPRAYS INTO BOTH NOSTRILS ONCE DAILY.  3  . omeprazole (PRILOSEC) 20 MG capsule   2  . OVER THE COUNTER MEDICATION Take 1 tablet by mouth daily.    Marland Kitchen PARAGARD INTRAUTERINE COPPER IU by Intrauterine route.    . Probiotic Product (PROBIOTIC-10) CAPS Take 1 tablet by mouth daily.    Marland Kitchen zolpidem (AMBIEN) 5 MG tablet TAKE 1 TABLET BY MOUTH NIGHTLY AS NEEDED FOR SLEEP     No current facility-administered medications on file prior to visit.     Physical Exam:  Vitals: BP 100/70   Pulse 78   Ht 5' 5.5" (1.664 m)   Wt 171 lb (77.6 kg)   LMP 01/23/2018 (Approximate)   BMI 28.02 kg/m   General: WF in  NAD HEENT: normocephalic, anicteric Neck: no thyroid enlargement, no palpable nodules, no cervical lymphadenopathy  Pulmonary: No increased work of breathing, CTAB Cardiovascular: RRR, without murmur  Breast: Breast symmetrical, no tenderness, no palpable nodules or masses, no skin or nipple retraction present, no nipple discharge.  No axillary, infraclavicular or supraclavicular lymphadenopathy. Abdomen: Soft, non-tender, non-distended.  Umbilicus without lesions.  No hepatomegaly or masses palpable. No evidence of hernia. Genitourinary:  External inflamed vestibule. Normal urethral meatus, normal Bartholin's and Skene's glands.    Vagina: white discharge, no evidence of prolapse.    Cervix: Grossly normal in appearance, no bleeding, non-tender, IUD strings at cervical os  Uterus: Retroflexed, normal size, shape, and consistency, mobile, and non-tender  Adnexa: No adnexal masses, non-tender  Rectal: deferred  Lymphatic: no evidence of inguinal lymphadenopathy Extremities: no edema, erythema, or tenderness Neurologic: Grossly intact Psychiatric: mood appropriate, affect full  OCT Wet Prep [place])     Status: Abnormal   Collection Time: 02/17/18 10:16 AM  Result Value Ref Range   Source Wet Prep POC vagina    WBC, Wet Prep HPF POC     Bacteria Wet Prep HPF POC     BACTERIA WET PREP MORPHOLOGY POC     Clue Cells Wet Prep HPF POC None None   Clue Cells Wet Prep Whiff POC     Yeast Wet Prep HPF POC Many (A) None   KOH Wet Prep POC     Trichomonas Wet Prep HPF POC Absent Absent    Assessment/ Plan: 47 y.o. annual gyn exam IUD in place Monilial vulvovaginitis/ recurrent yeast vulvovaginitis  Diflucan 150 mgm every 3 days x 3 doses, then weekly Diflucan 150 mgm x 8 weeks  Nystatin and Kenalog 0.1% ointment-mix and apply to vulva 2-3 times a day for itching ` hemoglobin A1C  1) Breast cancer screening - recommend monthly self breast exam and annual screening mammograms. Mammogram  was ordered today. Patient to call [facility] appointment at [facility]  2) Cervical cancer screening - Pap not done. ASCCP guidelines and rational discussed.  Patient opts for every 3 years screening interval. Next due next year  3) Osteoporosis prevention: discussed calcium and vitamin D requirements and encouraged weight bearing exercise.  4) RTO 1 year and prn   Farrel Conners, CNM

## 2018-02-17 DIAGNOSIS — G4700 Insomnia, unspecified: Secondary | ICD-10-CM | POA: Insufficient documentation

## 2018-02-17 DIAGNOSIS — N760 Acute vaginitis: Secondary | ICD-10-CM | POA: Insufficient documentation

## 2018-02-17 DIAGNOSIS — K219 Gastro-esophageal reflux disease without esophagitis: Secondary | ICD-10-CM | POA: Insufficient documentation

## 2018-02-17 LAB — POCT WET PREP (WET MOUNT): TRICHOMONAS WET PREP HPF POC: ABSENT

## 2018-02-17 LAB — HEMOGLOBIN A1C
Est. average glucose Bld gHb Est-mCnc: 105 mg/dL
HEMOGLOBIN A1C: 5.3 % (ref 4.8–5.6)

## 2018-02-28 ENCOUNTER — Encounter: Payer: Self-pay | Admitting: Certified Nurse Midwife

## 2018-03-06 ENCOUNTER — Ambulatory Visit
Admission: RE | Admit: 2018-03-06 | Discharge: 2018-03-06 | Disposition: A | Discharge status: Home / Self Care | Payer: BLUE CROSS/BLUE SHIELD | Source: Ambulatory Visit | Attending: Certified Nurse Midwife | Admitting: Certified Nurse Midwife

## 2018-03-06 ENCOUNTER — Other Ambulatory Visit: Payer: Self-pay | Admitting: Certified Nurse Midwife

## 2018-03-06 DIAGNOSIS — Z1231 Encounter for screening mammogram for malignant neoplasm of breast: Secondary | ICD-10-CM | POA: Diagnosis not present

## 2018-03-10 ENCOUNTER — Other Ambulatory Visit: Payer: Self-pay | Admitting: Certified Nurse Midwife

## 2018-04-10 ENCOUNTER — Telehealth: Payer: Self-pay

## 2018-04-10 NOTE — Telephone Encounter (Signed)
Returned Countess's call regarding left upper breast pain. Has been going on for weeks to a month. Is premenstrual Has a history of fibrocystic changes. Last mammogram 03/06/2018 was negative. Has been drinking more caffeine recently as well as red wine. No feeling any mass, but feels denser in that area.  Offered appointment in 3 days. Wants to see if tenderness goes away with dietary changes and vitamin E. To let me know if tenderness persists-will get ultrasound ordered.

## 2018-04-10 NOTE — Telephone Encounter (Signed)
Pt had mammogram done not too long ago.  Is now having pain on one side of her left breast.  Did anything show up on mammogram?  What to do?  [phone]

## 2018-04-26 DIAGNOSIS — Z79899 Other long term (current) drug therapy: Secondary | ICD-10-CM | POA: Diagnosis not present

## 2018-04-26 DIAGNOSIS — J019 Acute sinusitis, unspecified: Secondary | ICD-10-CM | POA: Diagnosis not present

## 2018-05-25 ENCOUNTER — Other Ambulatory Visit: Payer: Self-pay

## 2018-05-25 ENCOUNTER — Encounter: Payer: Self-pay | Admitting: Certified Nurse Midwife

## 2018-05-25 ENCOUNTER — Ambulatory Visit (INDEPENDENT_AMBULATORY_CARE_PROVIDER_SITE_OTHER): Payer: BLUE CROSS/BLUE SHIELD | Admitting: Certified Nurse Midwife

## 2018-05-25 VITALS — BP 128/80 | HR 97 | Ht 64.0 in | Wt 174.0 lb

## 2018-05-25 DIAGNOSIS — N898 Other specified noninflammatory disorders of vagina: Secondary | ICD-10-CM | POA: Diagnosis not present

## 2018-05-25 DIAGNOSIS — L292 Pruritus vulvae: Secondary | ICD-10-CM

## 2018-05-25 DIAGNOSIS — B373 Candidiasis of vulva and vagina: Secondary | ICD-10-CM

## 2018-05-25 DIAGNOSIS — B3731 Acute candidiasis of vulva and vagina: Secondary | ICD-10-CM

## 2018-05-25 DIAGNOSIS — M79622 Pain in left upper arm: Secondary | ICD-10-CM | POA: Diagnosis not present

## 2018-05-25 MED ORDER — FLUCONAZOLE 150 MG PO TABS: ORAL_TABLET | ORAL | 1 refills | Status: DC

## 2018-05-25 NOTE — Progress Notes (Signed)
Obstetrics & Gynecology Office Visit   Chief Complaint:  Chief Complaint  Patient presents with  . Breast Problem    left breast pain  . Gynecologic Exam    Yeast infection? d/c x 2-3 days s/p menses & abx treatment    History of Present Illness: Mary Miranda is a 48 year old Caucasian/White female, G1 P1001, whose LMP was 05/18/2018. She presents with two concerns today.   1) Has been having an intermittent discomfort and burning pain in her left axillary and upper inner arm for months. Breast exam in October at the time of her annual exam was unremarkable and she had a negative mammogram 03/06/2018. Advil usually relieves the pain. Is also taking vitamin E which has helped a little with some breast pain. Has a history of FC changes in the breast.   In May 2018 she was diagnosed with superficial thrombophlebitis of her upper left arm following an axillary abscess.  2) Has a history of recurrent yeast infections. Was treated for a yeast infection at the time of her last annual in October then she was prescribed one Diflucan pill weekly x 8 weeks and had no further symptoms until she took antibiotics for another sinus infection earlier this month. She was given one Diflucan pill for prophylaxis but she is still having some vulvar itching and white vaginal discharge.Marland Kitchen. Has been applying Mycolog ointment externally Review of Systems:  Review of Systems  Constitutional: Negative for chills, fever and weight loss.  HENT: Negative for congestion, sinus pain and sore throat.   Eyes: Negative for blurred vision and pain.  Respiratory: Negative for hemoptysis, shortness of breath and wheezing.   Cardiovascular: Negative for chest pain, palpitations and leg swelling.  Gastrointestinal: Negative for abdominal pain, blood in stool, diarrhea, heartburn, nausea and vomiting.  Genitourinary: Negative for dysuria, frequency, hematuria and urgency.       White vaginal discharge and vulvar itching    Musculoskeletal: Negative for back pain, joint pain and myalgias.  Skin: Negative for itching and rash.  Neurological: Negative for dizziness, tingling and headaches.  Endo/Heme/Allergies: Negative for environmental allergies and polydipsia. Does not bruise/bleed easily.       Negative for hirsutism   Psychiatric/Behavioral: Negative for depression. The patient is not nervous/anxious and does not have insomnia.    Breasts: tenderness in LUOQ and left axilla; negative for masses  Past Medical History:  Past Medical History:  Diagnosis Date  . Abnormal mammogram    Dr. Evette CristalSankar aware and watching  . Allergy   . Anxiety   . Gastrointestinal disorder   . GERD (gastroesophageal reflux disease)   . Plantar fasciitis     Past Surgical History:  Past Surgical History:  Procedure Laterality Date  . APPENDECTOMY    . CHOLECYSTECTOMY  2014   laparascopic  . FOOT SURGERY  2011   pantar fasciitis  . INTRAUTERINE DEVICE (IUD) INSERTION  08/2006, 05/2015   paragard    Gynecologic History: Patient's last menstrual period was 05/18/2018 (approximate).  Obstetric History: G1P1001  Family History:  Family History  Problem Relation Age of Onset  . Endometriosis Mother   . Heart disease Mother   . Thyroid disease Mother        hyperthyroidism  . Hyperlipidemia Father   . Hypertension Father   . Prostate cancer Father [id number]  . Hyperlipidemia Brother   . Hypertension Brother   . Uterine cancer Maternal Grandmother 50       early 4350s  .  Colon cancer Other 60       x4  . Colon cancer Other   . Breast cancer Neg Hx     Social History:  Social History   Socioeconomic History  . Marital status: Married    Spouse name: Not on file  . Number of children: 1  . Years of education: 67  . Highest education level: Not on file  Occupational History  . Occupation: Psychologist, counselling    Comment: assisted living  Social Needs  . Financial resource strain: Not on file  . Food insecurity:     Worry: Not on file    Inability: Not on file  . Transportation needs:    Medical: Not on file    Non-medical: Not on file  Tobacco Use  . Smoking status: Never Smoker  . Smokeless tobacco: Never Used  Substance and Sexual Activity  . Alcohol use: Yes    Comment: ocassionally  . Drug use: No  . Sexual activity: Yes    Birth control/protection: I.U.D.  Lifestyle  . Physical activity:    Days per week: Not on file    Minutes per session: Not on file  . Stress: Not on file  Relationships  . Social connections:    Talks on phone: Not on file    Gets together: Not on file    Attends religious service: Not on file    Active member of club or organization: Not on file    Attends meetings of clubs or organizations: Not on file    Relationship status: Not on file  . Intimate partner violence:    Fear of current or ex partner: Not on file    Emotionally abused: Not on file    Physically abused: Not on file    Forced sexual activity: Not on file  Other Topics Concern  . Not on file  Social History Narrative  . Not on file    Allergies:  Allergies  Allergen Reactions  . Amoxicillin-Pot Clavulanate     Other reaction(s): Other (See Comments) GI upset  . Codeine Itching    Medications:  Current Outpatient Medications on File Prior to Visit  Medication Sig Dispense Refill  . acetaminophen (TYLENOL) 325 MG tablet Take 2 tablets by mouth as needed.    Marland Kitchen azelastine (ASTELIN) 0.1 % nasal spray Place 1 spray into the nose as needed.    . Biotin 1000 MCG tablet Take 2,000 mcg by mouth daily.    . calcium-vitamin D 250-100 MG-UNIT tablet Take 1 tablet by mouth daily.    . Cholecalciferol (VITAMIN D3) 1000 units CAPS Take by mouth.    . Docosahexaenoic Acid (DHA COMPLETE) 200 MG CAPS Take 1 capsule by mouth daily.    Marland Kitchen docusate sodium (COLACE) 100 MG capsule Take 100 mg by mouth as needed for mild constipation.    . fluticasone (FLONASE) 50 MCG/ACT nasal spray PLACE 2 SPRAYS INTO  BOTH NOSTRILS ONCE DAILY.  3  . omeprazole (PRILOSEC) 20 MG capsule   2  . PARAGARD INTRAUTERINE COPPER IU by Intrauterine route.    . Probiotic Product (PROBIOTIC-10) CAPS Take 1 tablet by mouth daily.    . vitamin E (VITAMIN E) 400 UNIT capsule Take 800 Units by mouth daily.    Marland Kitchen zolpidem (AMBIEN) 5 MG tablet TAKE 1 TABLET BY MOUTH NIGHTLY AS NEEDED FOR SLEEP    . cetirizine (ZYRTEC) 10 MG tablet Take by mouth.    . nystatin ointment (MYCOSTATIN) Apply 1 application  topically 2 (two) times daily. (Patient not taking: Reported on 05/25/2018) 30 g 0  . OVER THE COUNTER MEDICATION Take 1 tablet by mouth daily.    Marland Kitchen triamcinolone ointment (KENALOG) 0.1 % Apply 1 application topically 2 (two) times daily. (Patient not taking: Reported on 05/25/2018) 30 g 0   No current facility-administered medications on file prior to visit.    Physical Exam Vitals: BP 128/80 (BP Location: Left Arm, Patient Position: Sitting, Cuff Size: Normal)   Pulse 97   Ht 5\' 4"  (1.626 m)   Wt 174 lb (78.9 kg)   LMP 05/18/2018 (Approximate)   BMI 29.87 kg/m        Physical Exam  Constitutional: She is oriented to person, place, and time. She appears well-developed and well-nourished. No distress.  Cardiovascular: Normal rate.  Respiratory: Effort normal.  Breasts: no masses, soft, non tender breasts bilaterally. No skin or nipple changes.  No chest wall tenderness. No axillary, infraclavicular or supraclavicular lymphadenopathy  Genitourinary:    Genitourinary Comments: Vulva: no inflammation or lesions Vagina: scant white discharge   Neurological: She is alert and oriented to person, place, and time.  Skin: Skin is warm and dry. No erythema.  Psychiatric: She has a normal mood and affect.     Assessment: 48 y.o. G1P1001 with monilial vulvovaginitis Axillary and upper arm paresthesia/ pain  Plan: Diflucan 150 mgm every three days x 2 doses.  Continue Mycolog for external itching BID Unsure etiology of  axillary and upper inner arm discomfort. Recommend returning to see DR Wyn Quaker who cared for her when she had the superficial thrombophlebitis in this area or with her PCP.    Farrel Conners, CNM

## 2018-05-26 LAB — POCT WET PREP (WET MOUNT): TRICHOMONAS WET PREP HPF POC: ABSENT

## 2018-06-18 ENCOUNTER — Other Ambulatory Visit: Payer: Self-pay | Admitting: Certified Nurse Midwife

## 2018-06-18 ENCOUNTER — Telehealth: Payer: Self-pay

## 2018-06-18 NOTE — Telephone Encounter (Signed)
Patient called and advised that I did order one refill on the Diflucan when I prescribed the last Diflucan. Farrel Conners, CNM

## 2018-06-18 NOTE — Telephone Encounter (Signed)
Pt calling asking for refill of yeast pill - sxs are not quite all gone.  [phone]

## 2018-06-22 DIAGNOSIS — J302 Other seasonal allergic rhinitis: Secondary | ICD-10-CM | POA: Diagnosis not present

## 2018-06-22 DIAGNOSIS — M5412 Radiculopathy, cervical region: Secondary | ICD-10-CM | POA: Diagnosis not present

## 2018-06-22 DIAGNOSIS — M5441 Lumbago with sciatica, right side: Secondary | ICD-10-CM | POA: Diagnosis not present

## 2018-06-22 DIAGNOSIS — R200 Anesthesia of skin: Secondary | ICD-10-CM | POA: Diagnosis not present

## 2018-07-09 DIAGNOSIS — D649 Anemia, unspecified: Secondary | ICD-10-CM | POA: Diagnosis not present

## 2018-07-24 ENCOUNTER — Telehealth: Payer: Self-pay

## 2018-07-24 ENCOUNTER — Telehealth: Payer: Self-pay | Admitting: Certified Nurse Midwife

## 2018-07-24 MED ORDER — FLUCONAZOLE 150 MG PO TABS: ORAL_TABLET | ORAL | 0 refills | Status: DC

## 2018-07-24 NOTE — Telephone Encounter (Signed)
Pt c/o another yeast inf.  Would like rx called in again.  [phone]

## 2018-07-24 NOTE — Telephone Encounter (Signed)
Returned patient's call. Has had problems with recurrent yeast infections, especially after her menses. She has cottage cheese discharge and vulvovaginal itching. Has been applying a topical antifungal for external itching. Has not been on any antibiotics recently.  RX for Diflucan called in. Will add an 8 week course of Diflucan for the recurrence.Mary Miranda

## 2018-08-10 DIAGNOSIS — I808 Phlebitis and thrombophlebitis of other sites: Secondary | ICD-10-CM | POA: Diagnosis not present

## 2018-08-10 DIAGNOSIS — M5441 Lumbago with sciatica, right side: Secondary | ICD-10-CM | POA: Diagnosis not present

## 2018-08-10 DIAGNOSIS — M5412 Radiculopathy, cervical region: Secondary | ICD-10-CM | POA: Diagnosis not present

## 2018-08-10 DIAGNOSIS — F5101 Primary insomnia: Secondary | ICD-10-CM | POA: Diagnosis not present

## 2018-11-02 DIAGNOSIS — J302 Other seasonal allergic rhinitis: Secondary | ICD-10-CM | POA: Diagnosis not present

## 2018-11-23 DIAGNOSIS — Z79899 Other long term (current) drug therapy: Secondary | ICD-10-CM | POA: Diagnosis not present

## 2018-11-23 DIAGNOSIS — J040 Acute laryngitis: Secondary | ICD-10-CM | POA: Diagnosis not present

## 2018-12-24 ENCOUNTER — Other Ambulatory Visit: Payer: Self-pay | Admitting: Certified Nurse Midwife

## 2018-12-24 ENCOUNTER — Telehealth: Payer: Self-pay

## 2018-12-24 DIAGNOSIS — Z1231 Encounter for screening mammogram for malignant neoplasm of breast: Secondary | ICD-10-CM

## 2018-12-24 NOTE — Telephone Encounter (Signed)
Apparently there is already an order in for the mammogram and she has been scheduled for her annual mammogram.

## 2018-12-24 NOTE — Telephone Encounter (Signed)
Patient was advised to contact our office to see if she needs an order for a mammogram. [facility] is her provider. [phone]

## 2018-12-25 NOTE — Telephone Encounter (Signed)
Patient aware.

## 2019-01-11 DIAGNOSIS — I808 Phlebitis and thrombophlebitis of other sites: Secondary | ICD-10-CM | POA: Diagnosis not present

## 2019-01-11 DIAGNOSIS — Z23 Encounter for immunization: Secondary | ICD-10-CM | POA: Diagnosis not present

## 2019-01-11 DIAGNOSIS — K219 Gastro-esophageal reflux disease without esophagitis: Secondary | ICD-10-CM | POA: Diagnosis not present

## 2019-01-11 DIAGNOSIS — F5101 Primary insomnia: Secondary | ICD-10-CM | POA: Diagnosis not present

## 2019-02-19 DIAGNOSIS — K219 Gastro-esophageal reflux disease without esophagitis: Secondary | ICD-10-CM | POA: Diagnosis not present

## 2019-02-19 DIAGNOSIS — F5101 Primary insomnia: Secondary | ICD-10-CM | POA: Diagnosis not present

## 2019-02-21 NOTE — Progress Notes (Signed)
Gynecology Annual Exam  PCP: Gracelyn Nurse, MD  Chief Complaint:  Chief Complaint  Patient presents with  . Gynecologic Exam    continues to have yeast inf issues    History of Present Illness:Mary Miranda presents today for her annual gyn exam. She is a 48 year old Caucasian/White female, G1 P1001, whose LMP was 02/13/2019  She has the following complaints: recurrent monthly yeast infections around the time of her menses for the last 3 years. She has chronic sinus infections and also gets yeast infection symptoms when taking the antibiotics. She was treated for a yeast infection at her last annual appointment with extended treatment for 8 weeks with a Diflucan weekly. This was repeated a few months later after she was on a course of antibiotics. Her yeast infections abate for a few months after treatment. She uses Monistat occasionally for symptom relief, but she reports burning with the use.   Her menses are monthly.  They vary in length and flow. Some last a few days and are just spotting, and others last 5-6 days with 2-3 heavier days sometimes requiring a tampon change every 1- 2 hours. She reports dysmenorrhea which responds to Advil or Tylenol. She also reports fatigue and body aches starting before menses and usually lasting 1-2 days after menses stops. She had a CBC done 06/2018 with hemoglobin of 11.1 and RBCs were microcytic and hypochromic. She also had a low vitamin B12. She is taking vitamin B12 supplements, but has been having GI problems and is concerned that she won't be able to tolerate iron supplements.  The patient's past medical history is remarkable for fibrocystic changes on mammogram, anxiety, insomnia,  and GERD. Two years ago she was diagnosed with superficial thrombophlebitis of her upper left arm following an axillary abscess. She occasionally has some transient pain in the left axillary area.  Since her last annual exam 02/16/2018, she has had no other  signifucant changes in her health. She occasionally has hot flashes during the day.   She is sexually active. She is currently using a Paraguard IUD for contraception. It was replaced 05/2015. She has been with current partner for 25+ years.  She does not have concerns about sex. She does not request STD testing.  Her most recent pap smear was obtained 02/11/2016 and was NIL/neg HRHPV. There is no history of abnormal Pap smears. Her most recent mammogram obtained on 03/06/2018 was [facility] 1.There is no family history of breast cancer. There is no family history of ovarian cancer. The patient does do self breast exams.  The patient does not smoke.  The patient does drink occasionally.  The patient does not use illegal drugs.  The patient exercises occasionally by walking. Has been trying to get into an exercise routine The patient does not get adequate calcium in her diet.  She does take calcium supplements and   1000 IU daily. She had a recent cholesterol screen in 2017 that was normal.   The patient denies current symptoms of depression.    Review of Systems: Review of Systems  Constitutional: Positive for malaise/fatigue (surrounding menses). Negative for chills, fever and weight loss.  HENT: Negative for congestion, sinus pain and sore throat.   Eyes: Negative for blurred vision and pain.  Respiratory: Negative for hemoptysis, shortness of breath and wheezing.   Cardiovascular: Negative for chest pain, palpitations and leg swelling.  Gastrointestinal: Positive for heartburn. Negative for abdominal pain, blood in stool, constipation, diarrhea,  nausea and vomiting.  Genitourinary: Negative for dysuria, frequency, hematuria and urgency.  Musculoskeletal: Negative for back pain, joint pain and myalgias.  Skin: Negative for itching and rash.  Neurological: Negative for dizziness, tingling and headaches.  Endo/Heme/Allergies: Positive for environmental allergies. Negative for polydipsia. Does not  bruise/bleed easily.       Negative for hirsutism. Positive for hot flashes   Psychiatric/Behavioral: Negative for depression. The patient is nervous/anxious and has insomnia.     Past Medical History:  Past Medical History:  Diagnosis Date  . Abnormal mammogram    Dr. Evette Cristal aware and watching  . Allergy   . Anxiety   . Gastrointestinal disorder   . GERD (gastroesophageal reflux disease)   . Plantar fasciitis     Past Surgical History:  Past Surgical History:  Procedure Laterality Date  . APPENDECTOMY    . CHOLECYSTECTOMY  2014   laparascopic  . FOOT SURGERY  2011   pantar fasciitis  . INTRAUTERINE DEVICE (IUD) INSERTION  08/2006, 05/2015   paragard    Family History:  Family History  Problem Relation Age of Onset  . Endometriosis Mother   . Heart disease Mother   . Thyroid disease Mother        hyperthyroidism  . Osteopenia Mother   . Hyperlipidemia Father   . Hypertension Father   . Prostate cancer Father 10  . Hyperlipidemia Brother   . Hypertension Brother   . Uterine cancer Maternal Grandmother 50       early 46s  . Colon cancer Other 60       x4  . Colon cancer Other   . Breast cancer Neg Hx     Social History:  Social History   Socioeconomic History  . Marital status: Married    Spouse name: Not on file  . Number of children: 1  . Years of education: 32  . Highest education level: Not on file  Occupational History  . Occupation: Psychologist, counselling    Comment: assisted living  Social Needs  . Financial resource strain: Not on file  . Food insecurity    Worry: Not on file    Inability: Not on file  . Transportation needs    Medical: Not on file    Non-medical: Not on file  Tobacco Use  . Smoking status: Never Smoker  . Smokeless tobacco: Never Used  Substance and Sexual Activity  . Alcohol use: Yes    Comment: ocassionally  . Drug use: No  . Sexual activity: Yes    Birth control/protection: I.U.D.  Lifestyle  . Physical activity     Days per week: Not on file    Minutes per session: Not on file  . Stress: Not on file  Relationships  . Social Musician on phone: Not on file    Gets together: Not on file    Attends religious service: Not on file    Active member of club or organization: Not on file    Attends meetings of clubs or organizations: Not on file    Relationship status: Not on file  . Intimate partner violence    Fear of current or ex partner: Not on file    Emotionally abused: Not on file    Physically abused: Not on file    Forced sexual activity: Not on file  Other Topics Concern  . Not on file  Social History Narrative  . Not on file    Allergies:  Allergies  Allergen Reactions  . Amoxicillin-Pot Clavulanate     Other reaction(s): Other (See Comments) GI upset  . Codeine Itching   Medication:  Current Outpatient Medications on File Prior to Visit  Medication Sig Dispense Refill  . acetaminophen (TYLENOL) 325 MG tablet Take 2 tablets by mouth as needed.    . Biotin 1000 MCG tablet Take 2,000 mcg by mouth daily.    . calcium-vitamin D 250-100 MG-UNIT tablet Take 1 tablet by mouth daily.    . cetirizine (ZYRTEC) 10 MG tablet Take by mouth.    . Cholecalciferol (VITAMIN D3) 1000 units CAPS Take by mouth.    . Cyanocobalamin 5000 MCG/ML LIQD Place 5,000 mcg under the tongue daily.    Marland Kitchen. docusate sodium (COLACE) 100 MG capsule Take 100 mg by mouth as needed for mild constipation.    . fluticasone (FLONASE) 50 MCG/ACT nasal spray PLACE 2 SPRAYS INTO BOTH NOSTRILS ONCE DAILY.  3  . gabapentin (NEURONTIN) 100 MG capsule Take 200 mg by mouth at bedtime.    Marland Kitchen. levocetirizine (XYZAL) 5 MG tablet Take 1 tablet by mouth daily.    . NEOMYCIN-POLYMYXIN-HYDROCORTISONE (CORTISPORIN) 1 % SOLN OTIC solution Place 10 mLs into both ears as needed.    . nystatin ointment (MYCOSTATIN) Apply 1 application topically 2 (two) times daily. 30 g 0  . OVER THE COUNTER MEDICATION Take 1 tablet by mouth daily.     Marland Kitchen. PARAGARD INTRAUTERINE COPPER IU by Intrauterine route.    . vitamin E (VITAMIN E) 400 UNIT capsule Take 800 Units by mouth daily.    Marland Kitchen. zolpidem (AMBIEN CR) 6.25 MG CR tablet Take 1 tablet by mouth as needed.     No current facility-administered medications on file prior to visit.     Physical Exam:  Vitals: BP 120/80   Pulse 84   Ht 5' 5.5" (1.664 m)   Wt 170 lb (77.1 kg)   LMP 02/13/2019 (Approximate)   BMI 27.86 kg/m   General: WF in NAD HEENT: normocephalic, anicteric Neck: no thyroid enlargement, no palpable nodules, no cervical lymphadenopathy  Pulmonary: No increased work of breathing, CTAB Cardiovascular: RRR, without murmur  Breast: Breast symmetrical, no tenderness, no palpable nodules or masses, no skin or nipple retraction present, no nipple discharge.  No axillary, infraclavicular or supraclavicular lymphadenopathy. Abdomen: Soft, non-tender, non-distended.  Umbilicus without lesions.  No hepatomegaly or masses palpable. No evidence of hernia. Genitourinary:  External: No lesions or inflammation.. Normal urethral meatus, normal Bartholin's and Skene's glands.    Vagina: white discharge, no evidence of prolapse.    Cervix: Grossly normal in appearance, scant bleeding with Pap smear, non-tender, IUD strings at cervical os  Uterus: Retroflexed, normal size, shape, and consistency, mobile, and non-tender  Adnexa: No adnexal masses, non-tender  Rectal: deferred  Lymphatic: no evidence of inguinal lymphadenopathy Extremities: no edema, erythema, or tenderness Neurologic: Grossly intact Psychiatric: mood appropriate, affect full  OCT Wet Prep Mellody Drown([place])     Status: Abnormal   Collection Time: 02/17/18 10:16 AM  Result Value Ref Range   Source Wet Prep POC vagina    WBC, Wet Prep HPF POC     Bacteria Wet Prep HPF POC     BACTERIA WET PREP MORPHOLOGY POC     Clue Cells Wet Prep HPF POC None None   Clue Cells Wet Prep Whiff POC     Yeast Wet Prep HPF POC none None    KOH Wet Prep POC     Trichomonas Wet Prep  HPF POC Absent Absent    Assessment/ Plan: 48 y.o. annual gyn exam IUD in place Irregular bleeding pattern-some heavy menses/ perimenopausal symptoms/ Paraguard IUD Mild anemia  1) Breast cancer screening - recommend monthly self breast exam and annual screening mammograms. Mammogram has already been scheduled at [facility] for 03/08/2019  2) Cervical cancer screening - Pap done. ASCCP guidelines and rational discussed.  Patient opts for every 3 years screening interval.   3) Osteoporosis prevention: discussed calcium and vitamin D requirements and encouraged weight bearing exercise.  4) Colon cancer screening: Discussed options with Tillie Rung. Her gastroenterologist recommends colonoscopy at age 33  5) 31 1 year and prn   Dalia Heading, CNM

## 2019-02-22 ENCOUNTER — Other Ambulatory Visit (HOSPITAL_COMMUNITY)
Admission: RE | Admit: 2019-02-22 | Discharge: 2019-02-22 | Disposition: A | Discharge status: Home / Self Care | Payer: BC Managed Care – PPO | Source: Ambulatory Visit | Attending: Certified Nurse Midwife | Admitting: Certified Nurse Midwife

## 2019-02-22 ENCOUNTER — Other Ambulatory Visit: Payer: Self-pay

## 2019-02-22 ENCOUNTER — Encounter: Payer: Self-pay | Admitting: Certified Nurse Midwife

## 2019-02-22 ENCOUNTER — Ambulatory Visit (INDEPENDENT_AMBULATORY_CARE_PROVIDER_SITE_OTHER): Payer: BC Managed Care – PPO | Admitting: Certified Nurse Midwife

## 2019-02-22 VITALS — BP 120/80 | HR 84 | Ht 65.5 in | Wt 170.0 lb

## 2019-02-22 DIAGNOSIS — Z124 Encounter for screening for malignant neoplasm of cervix: Secondary | ICD-10-CM | POA: Diagnosis not present

## 2019-02-22 DIAGNOSIS — Z01419 Encounter for gynecological examination (general) (routine) without abnormal findings: Secondary | ICD-10-CM | POA: Diagnosis not present

## 2019-02-22 DIAGNOSIS — Z1239 Encounter for other screening for malignant neoplasm of breast: Secondary | ICD-10-CM

## 2019-02-28 LAB — CYTOLOGY - PAP

## 2019-03-04 ENCOUNTER — Telehealth: Payer: Self-pay | Admitting: Certified Nurse Midwife

## 2019-03-04 ENCOUNTER — Encounter: Payer: Self-pay | Admitting: Certified Nurse Midwife

## 2019-03-04 DIAGNOSIS — R87619 Unspecified abnormal cytological findings in specimens from cervix uteri: Secondary | ICD-10-CM | POA: Insufficient documentation

## 2019-03-04 NOTE — Telephone Encounter (Signed)
Please schedule this patient for colposcopy with Dr Gilman Schmidt if possible. Thanks, [employer]

## 2019-03-04 NOTE — Telephone Encounter (Signed)
Patient is calling stating she need to schedule an gyn u/s and Biopsy. Not order in for me to schedule. Please advise

## 2019-03-04 NOTE — Telephone Encounter (Signed)
Pt calling; [facility] called her Ludwig Clarks about abnl pap and need for further test.  Pt called to sched this; front desk didn't know what to sched d/t no order.  [phone]

## 2019-03-05 NOTE — Telephone Encounter (Signed)
Patient is schedule 03/25/19 with [facility]. Patient added to wait list for more soon available appointment

## 2019-03-08 ENCOUNTER — Ambulatory Visit
Admission: RE | Admit: 2019-03-08 | Discharge: 2019-03-08 | Disposition: A | Discharge status: Home / Self Care | Payer: BC Managed Care – PPO | Source: Ambulatory Visit | Attending: Certified Nurse Midwife | Admitting: Certified Nurse Midwife

## 2019-03-08 DIAGNOSIS — Z1231 Encounter for screening mammogram for malignant neoplasm of breast: Secondary | ICD-10-CM | POA: Diagnosis not present

## 2019-03-25 ENCOUNTER — Other Ambulatory Visit (HOSPITAL_COMMUNITY)
Admission: RE | Admit: 2019-03-25 | Discharge: 2019-03-25 | Disposition: A | Discharge status: Home / Self Care | Payer: BC Managed Care – PPO | Source: Ambulatory Visit | Attending: Obstetrics and Gynecology | Admitting: Obstetrics and Gynecology

## 2019-03-25 ENCOUNTER — Encounter: Payer: Self-pay | Admitting: Obstetrics and Gynecology

## 2019-03-25 ENCOUNTER — Other Ambulatory Visit: Payer: Self-pay

## 2019-03-25 ENCOUNTER — Ambulatory Visit (INDEPENDENT_AMBULATORY_CARE_PROVIDER_SITE_OTHER): Payer: BC Managed Care – PPO | Admitting: Obstetrics and Gynecology

## 2019-03-25 VITALS — BP 120/80 | Ht 65.0 in | Wt 171.0 lb

## 2019-03-25 DIAGNOSIS — R87619 Unspecified abnormal cytological findings in specimens from cervix uteri: Secondary | ICD-10-CM | POA: Diagnosis not present

## 2019-03-25 DIAGNOSIS — N840 Polyp of corpus uteri: Secondary | ICD-10-CM

## 2019-03-25 DIAGNOSIS — N870 Mild cervical dysplasia: Secondary | ICD-10-CM

## 2019-03-25 NOTE — Progress Notes (Signed)
HPI:  Mary Miranda is a 48 y.o.  G1P1001  who presents today for evaluation and management of abnormal cervical cytology.    Dysplasia History:  Atypical glandular cells, NOS ("Cannot exclude malignancy" noted in comments)  OB History  Gravida Para Term Preterm AB Living  1 1 1     1   SAB TAB Ectopic Multiple Live Births          1    # Outcome Date GA Lbr Len/2nd Weight Sex Delivery Anes PTL Lv  1 Term 12/04/97   8 lb 4 oz (3.742 kg) M Vag-Spont   LIV    Past Medical History:  Diagnosis Date  . Abnormal mammogram    Dr. Jamal Collin aware and watching  . Allergy   . Anxiety   . Gastrointestinal disorder   . GERD (gastroesophageal reflux disease)   . Plantar fasciitis     Past Surgical History:  Procedure Laterality Date  . APPENDECTOMY    . CHOLECYSTECTOMY  2014   laparascopic  . FOOT SURGERY  2011   pantar fasciitis  . INTRAUTERINE DEVICE (IUD) INSERTION  08/2006, 05/2015   paragard    SOCIAL HISTORY:  Social History   Substance and Sexual Activity  Alcohol Use Yes   Comment: ocassionally    Social History   Substance and Sexual Activity  Drug Use No     Family History  Problem Relation Age of Onset  . Endometriosis Mother   . Heart disease Mother   . Thyroid disease Mother        hyperthyroidism  . Osteopenia Mother   . Hyperlipidemia Father   . Hypertension Father   . Prostate cancer Father 12  . Hyperlipidemia Brother   . Hypertension Brother   . Uterine cancer Maternal Grandmother 24       early 39s  . Colon cancer Other 60       x4  . Colon cancer Other   . Breast cancer Neg Hx     ALLERGIES:  Amoxicillin-pot clavulanate and Codeine  Current Outpatient Medications on File Prior to Visit  Medication Sig Dispense Refill  . acetaminophen (TYLENOL) 325 MG tablet Take 2 tablets by mouth as needed.    . Biotin 1000 MCG tablet Take 2,000 mcg by mouth daily.    . calcium-vitamin D 250-100 MG-UNIT tablet Take 1 tablet by mouth daily.     . cetirizine (ZYRTEC) 10 MG tablet Take by mouth.    . Cholecalciferol (VITAMIN D3) 1000 units CAPS Take by mouth.    . Cyanocobalamin 5000 MCG/ML LIQD Place 5,000 mcg under the tongue daily.    Marland Kitchen docusate sodium (COLACE) 100 MG capsule Take 100 mg by mouth as needed for mild constipation.    . fluticasone (FLONASE) 50 MCG/ACT nasal spray PLACE 2 SPRAYS INTO BOTH NOSTRILS ONCE DAILY.  3  . gabapentin (NEURONTIN) 100 MG capsule Take 200 mg by mouth at bedtime.    Marland Kitchen levocetirizine (XYZAL) 5 MG tablet Take 1 tablet by mouth daily.    . NEOMYCIN-POLYMYXIN-HYDROCORTISONE (CORTISPORIN) 1 % SOLN OTIC solution Place 10 mLs into both ears as needed.    . nystatin ointment (MYCOSTATIN) Apply 1 application topically 2 (two) times daily. 30 g 0  . OVER THE COUNTER MEDICATION Take 1 tablet by mouth daily.    Marland Kitchen PARAGARD INTRAUTERINE COPPER IU by Intrauterine route.    . vitamin E (VITAMIN E) 400 UNIT capsule Take 800 Units by mouth daily.    Marland Kitchen  zolpidem (AMBIEN CR) 6.25 MG CR tablet Take 1 tablet by mouth as needed.     No current facility-administered medications on file prior to visit.     Physical Exam: -Vitals:  BP 120/80   Ht 5\' 5"  (1.651 m)   Wt 171 lb (77.6 kg)   BMI 28.46 kg/m  GEN: WD, WN, NAD.  A+ O x 3, good mood and affect. ABD:  NT, ND.  Soft, no masses.  No hernias noted.   Pelvic:   Vulva: Normal appearance.  No lesions.  Vagina: No lesions or abnormalities noted.  Support: Normal pelvic support.  Urethra No masses tenderness or scarring.  Meatus Normal size without lesions or prolapse.  Cervix: See below. Note an anterior broad-based polypoid lesion noted with regular borders anteriorly just inside the transition zone.  Measures about 1.5 cm in width.   Anus: Normal exam.  No lesions.  Perineum: Normal exam.  No lesions.        Bimanual   Uterus: Normal size.  Non-tender.  Mobile.  AV.  Adnexae: No masses.  Non-tender to palpation.  Cul-de-sac: Negative for abnormality.    PROCEDURE: 1.  Urine Pregnancy Test:  not done due to presence of IUD 2.  Colposcopy performed with 4% acetic acid after verbal consent obtained                                         -Aceto-white Lesions Location(s): lightly scattered around cervix (mild). Only slightly more significant lesions noted at 4-5 o'clock, 8 o'clock and along the broad-based polypoid lesion at around 12 o'clock.              -Biopsy performed at 4:30, 8, and 12 o'clock               -ECC indicated and performed: Yes.       -Biopsy sites made hemostatic with pressure, AgNO3, and/or Monsel's solution   -Satisfactory colposcopy: No.    -Evidence of Invasive cervical CA :  NO  Endometrial Biopsy After discussion with the patient regarding her abnormal uterine bleeding I recommended that she proceed with an endometrial biopsy for further diagnosis. The risks, benefits, alternatives, and indications for an endometrial biopsy were discussed with the patient in detail. She understood the risks including infection, bleeding, cervical laceration and uterine perforation.  Verbal consent was obtained.   PROCEDURE NOTE:  Pipelle endometrial biopsy was performed using aseptic technique with iodine preparation.  The uterus was sounded to a length of 9 cm.  Adequate sampling was obtained with minimal blood loss.  The patient tolerated the procedure well.      ASSESSMENT:  Mary Miranda is a 48 y.o. G1P1001 here for  1. Atypical glandular cells of undetermined significance (AGUS) on cervical Pap smear   .  PLAN:  I discussed the grading system of pap smears and HPV high risk viral types.  We will discuss and base management after colpo results and endometrial biopsy results return.       52, MD  [facility], [facility] 03/25/2019  5:07 PM

## 2019-03-28 LAB — SURGICAL PATHOLOGY

## 2019-03-29 ENCOUNTER — Telehealth: Payer: Self-pay

## 2019-03-29 NOTE — Telephone Encounter (Signed)
Pt calling for results, pt aware they are not back.

## 2019-04-01 ENCOUNTER — Other Ambulatory Visit: Payer: Self-pay | Admitting: Obstetrics and Gynecology

## 2019-04-01 DIAGNOSIS — N921 Excessive and frequent menstruation with irregular cycle: Secondary | ICD-10-CM

## 2019-04-01 DIAGNOSIS — N840 Polyp of corpus uteri: Secondary | ICD-10-CM

## 2019-04-01 NOTE — Telephone Encounter (Signed)
Discussed pathology findings with patient.  Discussed finding of polyp.  Discussed recommended treatment options.  She has a very strong family history of colon cancer and uterine cancer.  She wants to understand whether a hysterectomy would be beneficial for her in order to prevent uterine cancer.  We discussed that she has not had genetic testing so it would be hard to make a prediction about her risk for developing colon and or uterine cancer.  Mutual decision made to perform a pelvic ultrasound in order to further characterize her uterus in the setting of her abnormal bleeding pattern.  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  Will obtain a pelvic ultrasound and further decision-making from there will be done.

## 2019-04-01 NOTE — Telephone Encounter (Signed)
Pt calling for bx results.  [phone]

## 2019-04-22 ENCOUNTER — Ambulatory Visit (INDEPENDENT_AMBULATORY_CARE_PROVIDER_SITE_OTHER): Payer: BC Managed Care – PPO | Admitting: Obstetrics and Gynecology

## 2019-04-22 ENCOUNTER — Ambulatory Visit (INDEPENDENT_AMBULATORY_CARE_PROVIDER_SITE_OTHER): Payer: BC Managed Care – PPO

## 2019-04-22 ENCOUNTER — Other Ambulatory Visit: Payer: Self-pay

## 2019-04-22 ENCOUNTER — Encounter: Payer: Self-pay | Admitting: Obstetrics and Gynecology

## 2019-04-22 DIAGNOSIS — N840 Polyp of corpus uteri: Secondary | ICD-10-CM | POA: Diagnosis not present

## 2019-04-22 DIAGNOSIS — N921 Excessive and frequent menstruation with irregular cycle: Secondary | ICD-10-CM

## 2019-04-22 NOTE — Progress Notes (Signed)
   Virtual Visit via Telephone Note  I connected with Mary Miranda on 04/22/19 at  3:50 PM EST by telephone and verified that I am speaking with the correct person using two identifiers.   I discussed the limitations, risks, security and privacy concerns of performing an evaluation and management service by telephone and the availability of in person appointments. I also discussed with the patient that there may be a patient responsible charge related to this service. The patient expressed understanding and agreed to proceed.  The patient was at home I spoke with the patient from my  office The names of people involved in this encounter were: Hedwig Morton and Prentice Docker, MD.   History of Present Illness: Ultrasound demonstrates the following findings Adnexa: no masses seen  Uterus: anteverted with endometrial stripe  9.6 mm Additional: IUD appears to be correctly positioned (Paragard IUD) No definitive endometrial polyp noted.   Since her biopsy she bled up until she had her period several weeks later.     Observations/Objective: Physical Exam could not be performed. Because of the COVID-19 outbreak this visit was performed over the phone and not in person.   Imaging Results US Transvaginal Non-OB [facility])  Result Date: 04/22/2019 Patient Name: Mary Miranda DOB: 11-12-1970 MRN: [id number] ULTRASOUND REPORT Location: [facility] Date of Service: 04/22/2019 Indications:Abnormal Uterine Bleeding Findings: The uterus is anteverted and measures 9.7 x 5.1 x 4.3 cm. Echo texture is homogenous without evidence of focal masses. The Endometrium measures 9.6 mm. The IUD is correctly placed within the uterus. Right Ovary measures 2.3 x 1.8 x 1.8 cm. It is normal in appearance. Left Ovary measures 2.7 x 2.0 x 1.8 cm. It is normal in appearance. Survey of the adnexa demonstrates no adnexal masses. There is no free fluid in the cul de sac. Impression: 1. Normal pelvic ultrasound 2. The  IUD is correctly placed within the uterus. Gweneth Dimitri, RT The ultrasound images and findings were reviewed by me and I agree with the above report. Prentice Docker, MD, [facility], [facility] 04/22/2019 4:00 PM     Assessment and Plan: 48 y.o. G80P1001 female with menorrhagia with irregular cycle and an biopsy-demonstrated endometrial biopsy.  Follow Up Instructions: Discussed treatment options of hysteroscopy, dilation and curettage, polypectomy, removal and replacement of IUD.  We also discussed observation with seeing how her bleeding goes. At this time she would like to schedule surgery. We may cancel surgery if the cost is too high.  We will await numbers from an insurance standpoint,   I discussed the assessment and treatment plan with the patient. The patient was provided an opportunity to ask questions and all were answered. The patient agreed with the plan and demonstrated an understanding of the instructions.   The patient was advised to call back or seek an in-person evaluation if the symptoms worsen or if the condition fails to improve as anticipated.  I provided 23:05 minutes of non-face-to-face time during this encounter.  Prentice Docker, MD  [facility], [facility] 04/22/2019 4:01 PM

## 2019-04-24 ENCOUNTER — Telehealth: Payer: Self-pay | Admitting: Obstetrics and Gynecology

## 2019-04-24 NOTE — Telephone Encounter (Signed)
-----   Message from Will Bonnet, MD sent at 04/22/2019  4:27 PM EST ----- Regarding: Schedule Surgery Surgery Booking Request Patient Full Name:  Mary Miranda  MRN: [id number]  DOB: 10/08/1970  Surgeon: Prentice Docker, MD  Requested Surgery Date and Time: Per patient preference Primary Diagnosis AND Code: Menorrhagia with irregular (N92.1), endometrial polyp (N84.0) Secondary Diagnosis and Code:  Surgical Procedure: hysteroscopy, dilation and curettage, endometrial polypectomy, removal of intrauterine device and replacement with either Paragard or Mirena (patient choice) L&D Notification: No Admission Status: same day surgery Length of Surgery: 45 minutes Special Case Needs: myosure, Paragard or Mirena (patient choice) H&P: No Phone Interview???:  Yes Interpreter: No Language:  Medical Clearance:  No Special Scheduling Instructions: none Any known health/anesthesia issues, diabetes, sleep apnea, latex allergy, defibrillator/pacemaker?: No Acuity: P3   (P1 highest, P2 delay may cause harm, P3 low, elective gyn, P4 lowest)

## 2019-04-24 NOTE — Telephone Encounter (Signed)
Lmtrc

## 2019-04-24 NOTE — Telephone Encounter (Signed)
Available OR dates given. Patient said her insurance plan renews 3/1, will likely schedule for March or April, patient will check schedule and call back.

## 2019-08-06 ENCOUNTER — Telehealth: Payer: Self-pay

## 2019-08-06 NOTE — Telephone Encounter (Signed)
Patient is calling to ask is this is something to expect until she has the polp removed. Patient is schedule for Wednesday, 08/14/19 and 4 :30. Please advise

## 2019-08-06 NOTE — Telephone Encounter (Signed)
Pt called triage c/o heavy bleeding. Pt needs an appt, was supposed to have surgery in march or this month, but did not call to f/u. Please schedule when she calls back

## 2019-08-06 NOTE — Telephone Encounter (Signed)
Please let me know when pt calls back and has reception. I have tried to call her twice.

## 2019-08-06 NOTE — Telephone Encounter (Signed)
This is something she should expect until polyp is removed.

## 2019-08-06 NOTE — Telephone Encounter (Signed)
Patient is calling back after being disconnect due to cell service.

## 2019-08-07 NOTE — Telephone Encounter (Signed)
Pt states her bleeding is not as bad this morning. Will decide closer to appt time if she wants to still come in (per pt)

## 2019-08-14 ENCOUNTER — Encounter: Payer: Self-pay | Admitting: Obstetrics and Gynecology

## 2019-08-14 ENCOUNTER — Ambulatory Visit: Payer: 59 | Admitting: Obstetrics and Gynecology

## 2019-08-14 ENCOUNTER — Other Ambulatory Visit: Payer: Self-pay

## 2019-08-14 VITALS — BP 144/93 | HR 80 | Ht 65.0 in | Wt 176.0 lb

## 2019-08-14 DIAGNOSIS — N840 Polyp of corpus uteri: Secondary | ICD-10-CM

## 2019-08-14 DIAGNOSIS — N921 Excessive and frequent menstruation with irregular cycle: Secondary | ICD-10-CM | POA: Diagnosis not present

## 2019-08-14 DIAGNOSIS — B373 Candidiasis of vulva and vagina: Secondary | ICD-10-CM | POA: Diagnosis not present

## 2019-08-14 DIAGNOSIS — B3731 Acute candidiasis of vulva and vagina: Secondary | ICD-10-CM

## 2019-08-14 MED ORDER — MEDROXYPROGESTERONE ACETATE 10 MG PO TABS: 10.0000 mg | ORAL_TABLET | Freq: Every day | ORAL | 5 refills | Status: DC

## 2019-08-14 MED ORDER — FLUCONAZOLE 150 MG PO TABS: 150.0000 mg | ORAL_TABLET | Freq: Once | ORAL | 1 refills | Status: AC

## 2019-08-14 NOTE — Progress Notes (Signed)
Obstetrics & Gynecology Office Visit   Chief Complaint  Patient presents with  . Advice Only    Discuss possible Polyp removal surgery   History of Present Illness: 49 y.o. G56P1001 female who presents to discuss her heavy bleeding in the setting of a likely polyp.  She has an IUD.  She had heavy bleeding this past week to the point where she was changing pads every hour for about 2 days. She didn't have cramping or feel bad, other than what she described.  Now that the bleeding is over she is having some lower back pain. She normally has bleeding that comes more or less monthly with varying amounts of bleeding (even sometimes not at all).  She thinks she also might have a yeast infection.  She states that she tends to get these a lot.  She normally just has the medication called in for her.     Past Medical History:  Diagnosis Date  . Abnormal mammogram    Dr. Evette Cristal aware and watching  . Allergy   . Anxiety   . Gastrointestinal disorder   . GERD (gastroesophageal reflux disease)   . Plantar fasciitis     Past Surgical History:  Procedure Laterality Date  . APPENDECTOMY    . CHOLECYSTECTOMY  2014   laparascopic  . FOOT SURGERY  2011   pantar fasciitis  . INTRAUTERINE DEVICE (IUD) INSERTION  08/2006, 05/2015   paragard    Gynecologic History: Patient's last menstrual period was 08/04/2019.  Obstetric History: G1P1001  Family History  Problem Relation Age of Onset  . Endometriosis Mother   . Heart disease Mother   . Thyroid disease Mother        hyperthyroidism  . Osteopenia Mother   . Hyperlipidemia Father   . Hypertension Father   . Prostate cancer Father 50  . Hyperlipidemia Brother   . Hypertension Brother   . Uterine cancer Maternal Grandmother 50       early 35s  . Colon cancer Other 60       x4  . Colon cancer Other   . Breast cancer Neg Hx     Social History   Socioeconomic History  . Marital status: Married    Spouse name: Not on file  . Number  of children: 1  . Years of education: 50  . Highest education level: Not on file  Occupational History  . Occupation: Psychologist, counselling    Comment: assisted living  Tobacco Use  . Smoking status: Never Smoker  . Smokeless tobacco: Never Used  Substance and Sexual Activity  . Alcohol use: Yes    Comment: ocassionally  . Drug use: No  . Sexual activity: Yes    Birth control/protection: I.U.D.  Other Topics Concern  . Not on file  Social History Narrative  . Not on file   Social Determinants of Health   Financial Resource Strain:   . Difficulty of Paying Living Expenses:   Food Insecurity:   . Worried About Programme researcher, broadcasting/film/video in the Last Year:   . Barista in the Last Year:   Transportation Needs:   . Freight forwarder (Medical):   Marland Kitchen Lack of Transportation (Non-Medical):   Physical Activity:   . Days of Exercise per Week:   . Minutes of Exercise per Session:   Stress:   . Feeling of Stress :   Social Connections:   . Frequency of Communication with Friends and Family:   . Frequency  of Social Gatherings with Friends and Family:   . Attends Religious Services:   . Active Member of Clubs or Organizations:   . Attends Archivist Meetings:   Marland Kitchen Marital Status:   Intimate Partner Violence:   . Fear of Current or Ex-Partner:   . Emotionally Abused:   Marland Kitchen Physically Abused:   . Sexually Abused:     Allergies  Allergen Reactions  . Amoxicillin-Pot Clavulanate     Other reaction(s): Other (See Comments) GI upset  . Codeine Itching    Prior to Admission medications   Medication Sig Start Date End Date Taking? Authorizing Provider  acetaminophen (TYLENOL) 325 MG tablet Take 2 tablets by mouth as needed.   Yes [provider]  Biotin 1000 MCG tablet Take 2,000 mcg by mouth daily.   Yes [provider]  calcium-vitamin D 250-100 MG-UNIT tablet Take 1 tablet by mouth daily.   Yes [provider]  cetirizine (ZYRTEC) 10 MG  tablet Take by mouth.   Yes [provider]  Cholecalciferol (VITAMIN D3) 1000 units CAPS Take by mouth.   Yes [provider]  Cyanocobalamin 5000 MCG/ML LIQD Place 5,000 mcg under the tongue daily.   Yes [provider]  docusate sodium (COLACE) 100 MG capsule Take 100 mg by mouth as needed for mild constipation.   Yes [provider]  fluticasone (FLONASE) 50 MCG/ACT nasal spray PLACE 2 SPRAYS INTO BOTH NOSTRILS ONCE DAILY. 07/25/16  Yes [provider]  gabapentin (NEURONTIN) 100 MG capsule Take 200 mg by mouth at bedtime. 09/12/18  Yes [provider]  levocetirizine (XYZAL) 5 MG tablet Take 1 tablet by mouth daily. 11/02/18 11/02/19 Yes [provider]  nystatin ointment (MYCOSTATIN) Apply 1 application topically 2 (two) times daily. 02/16/18  Yes Dalia Heading, CNM  OVER THE COUNTER MEDICATION Take 1 tablet by mouth daily.   Yes [provider]  PARAGARD INTRAUTERINE COPPER IU by Intrauterine route.   Yes [provider]  zolpidem (AMBIEN CR) 6.25 MG CR tablet Take 1 tablet by mouth as needed. 01/10/19  Yes [provider]    Review of Systems  Constitutional: Negative.   HENT: Negative.   Eyes: Negative.   Respiratory: Negative.   Cardiovascular: Negative.   Gastrointestinal: Negative.   Genitourinary: Negative.   Musculoskeletal: Negative.   Skin: Negative.   Neurological: Negative.   Psychiatric/Behavioral: Negative.      Physical Exam BP (!) 144/93   Pulse 80   Ht 5\' 5"  (1.651 m)   Wt 176 lb (79.8 kg)   LMP 08/04/2019   BMI 29.29 kg/m  Patient's last menstrual period was 08/04/2019. Physical Exam Constitutional:      General: She is not in acute distress.    Appearance: Normal appearance.  HENT:     Head: Normocephalic and atraumatic.  Eyes:     General: No scleral icterus.    Conjunctiva/sclera: Conjunctivae normal.  Neurological:     General: No focal deficit present.      Mental Status: She is alert and oriented to person, place, and time.     Cranial Nerves: No cranial nerve deficit.  Psychiatric:        Mood and Affect: Mood normal.        Behavior: Behavior normal.        Judgment: Judgment normal.     Female chaperone present for pelvic and breast  portions of the physical exam  Assessment: 49 y.o. G51P1001 female here for  1. Menorrhagia with irregular cycle   2. Endometrial polyp   3. Candidal vulvovaginitis      Plan: Problem List Items Addressed This Visit    None    Visit Diagnoses    Menorrhagia with irregular cycle    -  Primary   Relevant Medications   medroxyPROGESTERone (PROVERA) 10 MG tablet   Endometrial polyp       Candidal vulvovaginitis         Discussed treatment options. She has a history of an endometrial polyp as diagnosed by an endometrial biopsy.  She has since had a pelvic ultrasound that did not indicate a polyp.  We discussed options for further investigation, such as, saline infusion sonohysterogram, in-office hysteroscopy.  These would help [place] investigate the need for polypectomy.  I did discuss that if she does have a polyp and we have some reason to believe the polyp is present, then her bleeding may not improve without removal of the polyp.  We further discussed changing her IUD to hormone containing IUD like Mirena.  We further discussed ablation and hysterectomy.  For now, she would like to see how her bleeding goes. If things get worse, will consider one of the above investigations or treatments.   A total of 35 minutes were spent face-to-face with the patient as well as preparation, review, communication, and documentation during this encounter.    Thomasene Mohair, MD 08/14/2019 5:02 PM

## 2019-08-15 ENCOUNTER — Encounter: Payer: Self-pay | Admitting: Obstetrics and Gynecology

## 2019-11-29 ENCOUNTER — Telehealth: Payer: Self-pay

## 2019-11-29 NOTE — Telephone Encounter (Signed)
Pt calling; has appt c SDJ in Nov.  Would like mammogram done before then.  Needs order.  [phone] Pt aware SDJ not in office today but will be here Monday.

## 2019-12-03 ENCOUNTER — Other Ambulatory Visit: Payer: Self-pay | Admitting: Obstetrics and Gynecology

## 2019-12-03 DIAGNOSIS — Z1231 Encounter for screening mammogram for malignant neoplasm of breast: Secondary | ICD-10-CM

## 2019-12-03 NOTE — Telephone Encounter (Signed)
Left detailed msg order for mammo is in.

## 2019-12-03 NOTE — Telephone Encounter (Signed)
Order placed for screening mammogram

## 2020-02-24 ENCOUNTER — Other Ambulatory Visit (HOSPITAL_COMMUNITY)
Admission: RE | Admit: 2020-02-24 | Discharge: 2020-02-24 | Disposition: A | Discharge status: Home / Self Care | Payer: 59 | Source: Ambulatory Visit | Attending: Obstetrics and Gynecology | Admitting: Obstetrics and Gynecology

## 2020-02-24 ENCOUNTER — Encounter: Payer: Self-pay | Admitting: Obstetrics and Gynecology

## 2020-02-24 ENCOUNTER — Ambulatory Visit (INDEPENDENT_AMBULATORY_CARE_PROVIDER_SITE_OTHER): Payer: 59 | Admitting: Obstetrics and Gynecology

## 2020-02-24 ENCOUNTER — Other Ambulatory Visit: Payer: Self-pay

## 2020-02-24 VITALS — BP 116/82 | Ht 66.0 in | Wt 170.0 lb

## 2020-02-24 DIAGNOSIS — Z01419 Encounter for gynecological examination (general) (routine) without abnormal findings: Secondary | ICD-10-CM

## 2020-02-24 DIAGNOSIS — Z1339 Encounter for screening examination for other mental health and behavioral disorders: Secondary | ICD-10-CM | POA: Diagnosis not present

## 2020-02-24 DIAGNOSIS — Z124 Encounter for screening for malignant neoplasm of cervix: Secondary | ICD-10-CM | POA: Insufficient documentation

## 2020-02-24 DIAGNOSIS — R87619 Unspecified abnormal cytological findings in specimens from cervix uteri: Secondary | ICD-10-CM

## 2020-02-24 DIAGNOSIS — Z1331 Encounter for screening for depression: Secondary | ICD-10-CM | POA: Diagnosis not present

## 2020-02-24 NOTE — Progress Notes (Signed)
Gynecology Annual Exam  PCP: Gracelyn NurseJohnston, John D, MD  Chief Complaint  Patient presents with  . Annual Exam    History of Present Illness:  Ms. Mary Miranda is a 49 y.o. G1P1001 who LMP was Patient's last menstrual period was 02/05/2020 (approximate)., presents today for her annual examination.  Her menses are regular, usually coming monthly, lasting 5-10 day(s).  Dysmenorrhea is variable. Her cramping can be pretty bad (severe). She does not have intermenstrual bleeding.  She occasionally has hot flashes. This usually occurs when she is doing some activity and she stays hot (she has a hard time cooling down).    She is sexually active.. She does not have vaginal dryness.  Last Pap: 02/22/2020 Results were: glandular cell abnormality (AGUS)  Hx of STDs: none  Last mammogram: 03/07/2020  Results were: normal--routine follow-up in 12 months There is no FH of breast cancer. There is no FH of ovarian cancer. The patient does not do self-breast exams.  Colonoscopy: never had.   DEXA: has not been screened.   Tobacco use: The patient denies current or previous tobacco use. Alcohol use: social drinker Exercise: walking  The patient wears seatbelts: yes.     Past Medical History:  Diagnosis Date  . Abnormal mammogram    Dr. Evette CristalSankar aware and watching  . Allergy   . Anxiety   . Essential hypertension   . Gastrointestinal disorder   . GERD (gastroesophageal reflux disease)   . Plantar fasciitis     Past Surgical History:  Procedure Laterality Date  . APPENDECTOMY    . CHOLECYSTECTOMY  2014   laparascopic  . FOOT SURGERY  2011   pantar fasciitis  . INTRAUTERINE DEVICE (IUD) INSERTION  08/2006, 05/2015   paragard    Prior to Admission medications   Medication Sig Start Date End Date Taking? Authorizing Provider  acetaminophen (TYLENOL) 325 MG tablet Take 2 tablets by mouth as needed.   Yes [provider]  Biotin 1000 MCG tablet Take 2,000 mcg by mouth daily.   Yes  [provider]  cetirizine (ZYRTEC) 10 MG tablet Take by mouth.   Yes [provider]  Cholecalciferol (VITAMIN D3) 1000 units CAPS Take by mouth.   Yes [provider]  fluticasone (FLONASE) 50 MCG/ACT nasal spray PLACE 2 SPRAYS INTO BOTH NOSTRILS ONCE DAILY. 07/25/16  Yes [provider]  gabapentin (NEURONTIN) 100 MG capsule Take 200 mg by mouth at bedtime. 09/12/18  Yes [provider]  nystatin ointment (MYCOSTATIN) Apply 1 application topically 2 (two) times daily. 02/16/18  Yes Farrel ConnersGutierrez, Colleen, CNM  OVER THE COUNTER MEDICATION Take 1 tablet by mouth daily.   Yes [provider]  PARAGARD INTRAUTERINE COPPER IU by Intrauterine route.   Yes [provider]  zolpidem (AMBIEN CR) 6.25 MG CR tablet Take 1 tablet by mouth as needed. 01/10/19  Yes [provider]  calcium-vitamin D 250-100 MG-UNIT tablet Take 1 tablet by mouth daily. Patient not taking: Reported on 02/24/2020    [provider]  Cyanocobalamin 5000 MCG/ML LIQD Place 5,000 mcg under the tongue daily.    [provider]  docusate sodium (COLACE) 100 MG capsule Take 100 mg by mouth as needed for mild constipation. Patient not taking: Reported on 02/24/2020    [provider]  levocetirizine (XYZAL) 5 MG tablet Take 1 tablet by mouth daily. 11/02/18 11/02/19  [provider]  lisinopril (ZESTRIL) 5 MG tablet Take 5 mg by mouth daily. 12/01/19  [provider]  medroxyPROGESTERone (PROVERA) 10 MG tablet Take 1 tablet (10 mg total) by mouth daily for 10 days. 08/14/19 08/24/19  Conard Novak, MD     Allergies  Allergen Reactions  . Amoxicillin-Pot Clavulanate     Other reaction(s): Other (See Comments) GI upset  . Codeine Itching   Obstetric History: G1P1001  Family History  Problem Relation Age of Onset  . Endometriosis Mother   . Heart disease Mother   . Thyroid disease Mother        hyperthyroidism  .  Osteopenia Mother   . Hyperlipidemia Father   . Hypertension Father   . Prostate cancer Father 62  . Hyperlipidemia Brother   . Hypertension Brother   . Uterine cancer Maternal Grandmother 50       early 74s  . Colon cancer Other 60       x4  . Colon cancer Other   . Breast cancer Neg Hx     Social History   Socioeconomic History  . Marital status: Married    Spouse name: Not on file  . Number of children: 1  . Years of education: 12  . Highest education level: Not on file  Occupational History  . Occupation: Psychologist, counselling    Comment: assisted living  Tobacco Use  . Smoking status: Never Smoker  . Smokeless tobacco: Never Used  Vaping Use  . Vaping Use: Never used  Substance and Sexual Activity  . Alcohol use: Yes    Comment: ocassionally  . Drug use: No  . Sexual activity: Yes    Birth control/protection: I.U.D.  Other Topics Concern  . Not on file  Social History Narrative  . Not on file   Social Determinants of Health   Financial Resource Strain:   . Difficulty of Paying Living Expenses: Not on file  Food Insecurity:   . Worried About Programme researcher, broadcasting/film/video in the Last Year: Not on file  . Ran Out of Food in the Last Year: Not on file  Transportation Needs:   . Lack of Transportation (Medical): Not on file  . Lack of Transportation (Non-Medical): Not on file  Physical Activity:   . Days of Exercise per Week: Not on file  . Minutes of Exercise per Session: Not on file  Stress:   . Feeling of Stress : Not on file  Social Connections:   . Frequency of Communication with Friends and Family: Not on file  . Frequency of Social Gatherings with Friends and Family: Not on file  . Attends Religious Services: Not on file  . Active Member of Clubs or Organizations: Not on file  . Attends Banker Meetings: Not on file  . Marital Status: Not on file  Intimate Partner Violence:   . Fear of Current or Ex-Partner: Not on file  . Emotionally Abused: Not  on file  . Physically Abused: Not on file  . Sexually Abused: Not on file    Review of Systems  Constitutional: Negative.   HENT: Negative.   Eyes: Negative.   Respiratory: Negative.   Cardiovascular: Negative.   Gastrointestinal: Negative.   Genitourinary: Negative.   Musculoskeletal: Negative.   Skin: Negative.   Neurological: Negative.   Psychiatric/Behavioral: Negative.      Physical Exam BP 116/82   Ht 5\' 6"  (1.676 m)   Wt 170 lb (77.1 kg)   LMP 02/05/2020 (Approximate)   BMI 27.44 kg/m   Physical Exam Constitutional:  General: She is not in acute distress.    Appearance: Normal appearance. She is well-developed.  Genitourinary:     Pelvic exam was performed with patient in the lithotomy position.     Vulva, urethra, bladder and uterus normal.     No inguinal adenopathy present in the right or left side.    No signs of injury in the vagina.     No vaginal discharge, erythema, tenderness or bleeding.     No cervical motion tenderness, discharge, lesion or polyp.     IUD strings visualized.     Uterus is mobile.     Uterus is not enlarged or tender.     No uterine mass detected.    Uterus is anteverted.     No right or left adnexal mass present.     Right adnexa not tender or full.     Left adnexa not tender or full.  HENT:     Head: Normocephalic and atraumatic.  Eyes:     General: No scleral icterus.    Conjunctiva/sclera: Conjunctivae normal.  Neck:     Thyroid: No thyromegaly.  Cardiovascular:     Rate and Rhythm: Normal rate and regular rhythm.     Heart sounds: No murmur heard.  No friction rub. No gallop.   Pulmonary:     Effort: Pulmonary effort is normal. No respiratory distress.     Breath sounds: Normal breath sounds. No wheezing or rales.  Chest:     Breasts:        Right: No inverted nipple, mass, nipple discharge, skin change or tenderness.        Left: No inverted nipple, mass, nipple discharge, skin change or tenderness.   Abdominal:     General: Bowel sounds are normal. There is no distension.     Palpations: Abdomen is soft. There is no mass.     Tenderness: There is no abdominal tenderness. There is no guarding or rebound.  Musculoskeletal:        General: No swelling or tenderness. Normal range of motion.     Cervical back: Normal range of motion and neck supple.  Lymphadenopathy:     Cervical: No cervical adenopathy.     Lower Body: No right inguinal adenopathy. No left inguinal adenopathy.  Neurological:     General: No focal deficit present.     Mental Status: She is alert and oriented to person, place, and time.     Cranial Nerves: No cranial nerve deficit.  Skin:    General: Skin is warm and dry.     Findings: No erythema or rash.  Psychiatric:        Mood and Affect: Mood normal.        Behavior: Behavior normal.        Judgment: Judgment normal.     Female chaperone present for pelvic and breast  portions of the physical exam  Results: AUDIT Questionnaire (screen for alcoholism): 1 PHQ-9: 2  Assessment: 49 y.o. G57P1001 female here for routine gynecologic examination.  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 Patient will call her gastroenterologist to discuss Cologard vs. Colonoscopy. Discussed the differences and advantages/disadvantages with her today. -- Mammogram - due - already scheduled at [facility] on 03/11/2020 -- Weight screening: normal -- Depression screening negative (PHQ-9) -- Nutrition:  normal -- cholesterol screening: per PCP -- osteoporosis screening: not due -- tobacco screening: not using -- alcohol screening: AUDIT questionnaire indicates low-risk usage. -- family history of breast cancer screening: done. not at high risk. -- no evidence of domestic violence or intimate partner violence. -- STD screening: gonorrhea/chlamydia NAAT not collected per patient request. -- pap smear collected per ASCCP guidelines -- flu vaccine will receive -- HPV vaccination series: not eligilbe   -- has received two doses of [place] vaccine and plans to get the booster, as she works in health care.  Thomasene Mohair, MD 02/24/2020 9:14 AM

## 2020-02-27 LAB — CYTOLOGY - PAP
Comment: NEGATIVE
Diagnosis: NEGATIVE
High risk HPV: NEGATIVE

## 2020-03-11 ENCOUNTER — Ambulatory Visit
Admission: RE | Admit: 2020-03-11 | Discharge: 2020-03-11 | Disposition: A | Discharge status: Home / Self Care | Payer: 59 | Source: Ambulatory Visit | Attending: Obstetrics and Gynecology | Admitting: Obstetrics and Gynecology

## 2020-03-11 ENCOUNTER — Other Ambulatory Visit: Payer: Self-pay

## 2020-03-11 DIAGNOSIS — Z1231 Encounter for screening mammogram for malignant neoplasm of breast: Secondary | ICD-10-CM | POA: Diagnosis present

## 2020-06-03 ENCOUNTER — Ambulatory Visit (INDEPENDENT_AMBULATORY_CARE_PROVIDER_SITE_OTHER): Payer: 59 | Admitting: Obstetrics and Gynecology

## 2020-06-03 ENCOUNTER — Encounter: Payer: Self-pay | Admitting: Obstetrics and Gynecology

## 2020-06-03 VITALS — BP 156/83 | Ht 65.0 in | Wt 180.0 lb

## 2020-06-03 DIAGNOSIS — N914 Secondary oligomenorrhea: Secondary | ICD-10-CM | POA: Diagnosis not present

## 2020-06-03 NOTE — Progress Notes (Signed)
Obstetrics & Gynecology Office Visit   Chief Complaint  Patient presents with  . Menstrual Problem   History of Present Illness: 50 y.o. G75P1001 female who presents with lessening periods.  She had a light period in December, spotting in January, and nothing so far this month. She has had pre-menstrual-like symptoms.  She doesn't sleep well. She takes [place]. She doesn't note any worsening of hot flashes. She has had times where she has had difficulty cooling down. This is unchanged.  She has started no new medication. She feels more stressed (though she feels like she is always stressed).  She has taken two home tests that were both negative.  She denies bloating, new constipation, early satiety.    Paps 2020: AGUS  Colpo 2020: CIN 1, neg ECC, emb showed polyp otherwise normal Ultrasound 2020: IUD in place. No obvious polyp. Endometrial stripe = 9.6 mm.  Pap 2021: NILM, HPV negative Paragard IUD in place  Past Medical History:  Diagnosis Date  . Abnormal mammogram    Dr. Evette Cristal aware and watching  . Allergy   . Anxiety   . Essential hypertension   . Gastrointestinal disorder   . GERD (gastroesophageal reflux disease)   . Plantar fasciitis     Past Surgical History:  Procedure Laterality Date  . APPENDECTOMY    . CHOLECYSTECTOMY  2014   laparascopic  . FOOT SURGERY  2011   pantar fasciitis  . INTRAUTERINE DEVICE (IUD) INSERTION  08/2006, 05/2015   paragard    Gynecologic History: Patient's last menstrual period was 04/02/2020.  Obstetric History: G1P1001  Family History  Problem Relation Age of Onset  . Endometriosis Mother   . Heart disease Mother   . Thyroid disease Mother        hyperthyroidism  . Osteopenia Mother   . Hyperlipidemia Father   . Hypertension Father   . Prostate cancer Father 72  . Hyperlipidemia Brother   . Hypertension Brother   . Uterine cancer Maternal Grandmother 50       early 69s  . Colon cancer Other 60       x4  . Colon cancer  Other   . Breast cancer Neg Hx     Social History   Socioeconomic History  . Marital status: Married    Spouse name: Not on file  . Number of children: 1  . Years of education: 61  . Highest education level: Not on file  Occupational History  . Occupation: Psychologist, counselling    Comment: assisted living  Tobacco Use  . Smoking status: Never Smoker  . Smokeless tobacco: Never Used  Vaping Use  . Vaping Use: Never used  Substance and Sexual Activity  . Alcohol use: Yes    Comment: ocassionally  . Drug use: No  . Sexual activity: Yes    Birth control/protection: I.U.D.  Other Topics Concern  . Not on file  Social History Narrative  . Not on file   Social Determinants of Health   Financial Resource Strain: Not on file  Food Insecurity: Not on file  Transportation Needs: Not on file  Physical Activity: Not on file  Stress: Not on file  Social Connections: Not on file  Intimate Partner Violence: Not on file    Allergies  Allergen Reactions  . Amoxicillin-Pot Clavulanate     Other reaction(s): Other (See Comments) GI upset  . Codeine Itching    Prior to Admission medications   Medication Sig Start Date End Date Taking? Authorizing  Provider  acetaminophen (TYLENOL) 325 MG tablet Take 2 tablets by mouth as needed.    [provider]  Biotin 1000 MCG tablet Take 2,000 mcg by mouth daily.    [provider]  calcium-vitamin D 250-100 MG-UNIT tablet Take 1 tablet by mouth daily. Patient not taking: Reported on 02/24/2020    [provider]  cetirizine (ZYRTEC) 10 MG tablet Take by mouth.    [provider]  Cholecalciferol (VITAMIN D3) 1000 units CAPS Take by mouth.    [provider]  Cyanocobalamin 5000 MCG/ML LIQD Place 5,000 mcg under the tongue daily.    [provider]  fluticasone (FLONASE) 50 MCG/ACT nasal spray PLACE 2 SPRAYS INTO BOTH NOSTRILS ONCE DAILY. 07/25/16   [provider]  gabapentin  (NEURONTIN) 100 MG capsule Take 200 mg by mouth at bedtime. 09/12/18   [provider]  levocetirizine (XYZAL) 5 MG tablet Take 1 tablet by mouth daily. 11/02/18 11/02/19  [provider]  lisinopril (ZESTRIL) 5 MG tablet Take 5 mg by mouth daily. 12/01/19   [provider]  medroxyPROGESTERone (PROVERA) 10 MG tablet Take 1 tablet (10 mg total) by mouth daily for 10 days. 08/14/19 08/24/19  Conard Novak, MD  nystatin ointment (MYCOSTATIN) Apply 1 application topically 2 (two) times daily. 02/16/18   Farrel Conners, CNM  OVER THE COUNTER MEDICATION Take 1 tablet by mouth daily.    [provider]  PARAGARD INTRAUTERINE COPPER IU by Intrauterine route.    [provider]  zolpidem (AMBIEN CR) 6.25 MG CR tablet Take 1 tablet by mouth as needed. 01/10/19   [provider]    Review of Systems  Constitutional: Negative.   HENT: Negative.   Eyes: Negative.   Respiratory: Negative.   Cardiovascular: Negative.   Gastrointestinal: Negative.   Genitourinary: Negative.   Musculoskeletal: Negative.   Skin: Negative.   Neurological: Negative.   Psychiatric/Behavioral: Negative.      Physical Exam BP (!) 156/83   Ht 5\' 5"  (1.651 m)   Wt 180 lb (81.6 kg)   LMP 04/02/2020   BMI 29.95 kg/m  Patient's last menstrual period was 04/02/2020. Physical Exam Constitutional:      General: She is not in acute distress.    Appearance: Normal appearance.  HENT:     Head: Normocephalic and atraumatic.  Eyes:     General: No scleral icterus.    Conjunctiva/sclera: Conjunctivae normal.  Neurological:     General: No focal deficit present.     Mental Status: She is alert and oriented to person, place, and time.     Cranial Nerves: No cranial nerve deficit.  Psychiatric:        Mood and Affect: Mood normal.        Behavior: Behavior normal.        Judgment: Judgment normal.     Female chaperone present for pelvic and breast  portions of the  physical exam  Assessment: 50 y.o. G69P1001 female here for  1. Secondary oligomenorrhea      Plan: Problem List Items Addressed This Visit   None   Visit Diagnoses    Secondary oligomenorrhea    -  Primary     Discussed that more concerning issues would lead, most likely, to increased bleeding.  She actually has less bleeding. This could be related to stress, or more likely, climacteric. Gave bleeding precautions and symptoms to be concerned about, such as bloating, early satiety, new or worsening constipation.  Will continue routine care for now.  I did offer ultrasound, if she is very concerned. She has a strong family history of uterine and colon cancer.  So, she is somewhat concerned. I offered her a pregnancy test, which she also declined.  Pregnancy is lower likelihood due to two negative home tests and having a Paragard IUD.   A total of 22 minutes were spent face-to-face with the patient as well as preparation, review, communication, and documentation during this encounter.    Thomasene Mohair, MD 06/03/2020 1:54 PM

## 2020-08-20 ENCOUNTER — Other Ambulatory Visit: Payer: Self-pay | Admitting: Obstetrics and Gynecology

## 2020-08-20 DIAGNOSIS — N921 Excessive and frequent menstruation with irregular cycle: Secondary | ICD-10-CM

## 2020-08-24 ENCOUNTER — Other Ambulatory Visit: Payer: Self-pay | Admitting: Obstetrics and Gynecology

## 2020-08-24 DIAGNOSIS — N914 Secondary oligomenorrhea: Secondary | ICD-10-CM

## 2020-12-15 ENCOUNTER — Other Ambulatory Visit: Payer: Self-pay | Admitting: Obstetrics and Gynecology

## 2020-12-15 DIAGNOSIS — Z1231 Encounter for screening mammogram for malignant neoplasm of breast: Secondary | ICD-10-CM

## 2021-01-23 DIAGNOSIS — H6690 Otitis media, unspecified, unspecified ear: Secondary | ICD-10-CM

## 2021-01-23 HISTORY — DX: Otitis media, unspecified, unspecified ear: H66.90

## 2021-02-25 ENCOUNTER — Ambulatory Visit (INDEPENDENT_AMBULATORY_CARE_PROVIDER_SITE_OTHER): Payer: 59 | Admitting: Obstetrics and Gynecology

## 2021-02-25 ENCOUNTER — Other Ambulatory Visit: Payer: Self-pay

## 2021-02-25 ENCOUNTER — Other Ambulatory Visit (HOSPITAL_COMMUNITY)
Admission: RE | Admit: 2021-02-25 | Discharge: 2021-02-25 | Disposition: A | Discharge status: Home / Self Care | Payer: 59 | Source: Ambulatory Visit | Attending: Obstetrics and Gynecology | Admitting: Obstetrics and Gynecology

## 2021-02-25 ENCOUNTER — Encounter: Payer: Self-pay | Admitting: Obstetrics and Gynecology

## 2021-02-25 VITALS — BP 120/80 | Ht 65.0 in | Wt 169.0 lb

## 2021-02-25 DIAGNOSIS — Z01419 Encounter for gynecological examination (general) (routine) without abnormal findings: Secondary | ICD-10-CM

## 2021-02-25 DIAGNOSIS — N921 Excessive and frequent menstruation with irregular cycle: Secondary | ICD-10-CM

## 2021-02-25 DIAGNOSIS — Z1339 Encounter for screening examination for other mental health and behavioral disorders: Secondary | ICD-10-CM

## 2021-02-25 DIAGNOSIS — Z1331 Encounter for screening for depression: Secondary | ICD-10-CM | POA: Diagnosis not present

## 2021-02-25 DIAGNOSIS — R87619 Unspecified abnormal cytological findings in specimens from cervix uteri: Secondary | ICD-10-CM

## 2021-02-25 MED ORDER — MEDROXYPROGESTERONE ACETATE 10 MG PO TABS: ORAL_TABLET | ORAL | 11 refills | Status: AC

## 2021-02-25 NOTE — Progress Notes (Signed)
Gynecology Annual Exam  PCP: Gracelyn Nurse, MD  Chief Complaint:  Chief Complaint  Patient presents with   Gynecologic Exam    Excessive bleeding she has during periods; not every period; is sporatic; provera refills.    History of Present Illness:  Ms. Mary Miranda is a 50 y.o. G1P1001 who LMP was Patient's last menstrual period was 01/25/2021 (approximate)., presents today for her annual examination.  Her menses are monthly (~28 days), lasting 8 days, one of these days is heavy. For this she will use provera. She doesn't usually take it for the full amount of time. So, one prescription will last her for several months. She has a Paragard IUD.   She does have vasomotor sx. Usually this is that she can't cool down during the day. She doesn't tend to have them at night.   She is sexually active. She does not have vaginal dryness.  Last Pap:  Paps 2020: AGUS             Colpo 2020: CIN 1, neg ECC, emb showed polyp otherwise normal Ultrasound 2020: IUD in place. No obvious polyp. Endometrial stripe = 9.6 mm.  Pap 2021: NILM, HPV negative  Hx of STDs: none  Last mammogram: 1 year  Results were: normal--routine follow-up in 12 months There is no FH of breast cancer. There is no FH of ovarian cancer. The patient does do self-breast exams.  Colonoscopy: earlier this year. Follow up 10 years.  DEXA: has not been screened for osteoporosis  Tobacco use: The patient denies current or previous tobacco use. Alcohol use: social drinker Exercise: yes, walking and elliptical.   The patient wears seatbelts: yes.     Past Medical History:  Diagnosis Date   Abnormal mammogram    Dr. Evette Cristal aware and watching   Allergy    Anxiety    Ear infection 01/2021   Essential hypertension    Gastrointestinal disorder    GERD (gastroesophageal reflux disease)    Plantar fasciitis     Past Surgical History:  Procedure Laterality Date   APPENDECTOMY     CHOLECYSTECTOMY  2014    laparascopic   FOOT SURGERY  2011   pantar fasciitis   INTRAUTERINE DEVICE (IUD) INSERTION  08/2006, 05/2015   paragard    Prior to Admission medications   Medication Sig Start Date End Date Taking? Authorizing Provider  acetaminophen (TYLENOL) 325 MG tablet Take 2 tablets by mouth as needed.   Yes [provider]  calcium-vitamin D 250-100 MG-UNIT tablet Take 1 tablet by mouth daily.   Yes [provider]  cefdinir (OMNICEF) 300 MG capsule Take 1 capsule by mouth in the morning and at bedtime. 06/19/20 03/06/21 Yes [provider]  cetirizine (ZYRTEC) 10 MG tablet Take by mouth.   Yes [provider]  Cholecalciferol (VITAMIN D3) 1000 units CAPS Take by mouth.   Yes [provider]  Cyanocobalamin 5000 MCG/ML LIQD Place 5,000 mcg under the tongue daily.   Yes [provider]  fluticasone (FLONASE) 50 MCG/ACT nasal spray PLACE 2 SPRAYS INTO BOTH NOSTRILS ONCE DAILY. 07/25/16  Yes [provider]  gabapentin (NEURONTIN) 100 MG capsule Take 200 mg by mouth at bedtime. 09/12/18  Yes [provider]  medroxyPROGESTERone (PROVERA) 10 MG tablet TAKE 1 TABLET BY MOUTH EVERY DAY FOR 10 DAYS 08/21/20  Yes Conard Novak, MD  pantoprazole (PROTONIX) 40 MG tablet Take 1 tablet by mouth daily. 05/26/20  Yes [provider]  PARAGARD INTRAUTERINE COPPER IU by Intrauterine route.   Yes [provider]  predniSONE (DELTASONE) 10 MG tablet Take 1 tablet by mouth daily. 02/24/21  Yes [provider]  rosuvastatin (CRESTOR) 10 MG tablet Take 10 mg by mouth daily. 01/27/21  Yes [provider]  zolpidem (AMBIEN CR) 6.25 MG CR tablet Take 1 tablet by mouth as needed. 01/10/19  Yes [provider]  Biotin 1000 MCG tablet Take 2,000 mcg by mouth daily. Patient not taking: Reported on 02/25/2021    [provider]  docusate sodium (COLACE) 100 MG capsule Take 100 mg by mouth as needed for mild  constipation. Patient not taking: No sig reported    [provider]  levocetirizine (XYZAL) 5 MG tablet Take 1 tablet by mouth daily. 11/02/18 11/02/19  [provider]  OVER THE COUNTER MEDICATION Take 1 tablet by mouth daily.    [provider]  OVER THE COUNTER MEDICATION Take 1 capsule by mouth daily.    [provider]    Allergies  Allergen Reactions   Amoxicillin-Pot Clavulanate     Other reaction(s): Other (See Comments) GI upset   Codeine Itching   Obstetric History: G1P1001  Family History  Problem Relation Age of Onset   Endometriosis Mother    Heart disease Mother    Thyroid disease Mother        hyperthyroidism   Osteopenia Mother    Hyperlipidemia Father    Hypertension Father    Prostate cancer Father 65   Hyperlipidemia Brother    Hypertension Brother    Cancer Paternal Uncle 24       place on brain and lung cancer   Uterine cancer Maternal Grandmother 41       early 105s   Colon cancer Other 60       x4   Colon cancer Other    Breast cancer Neg Hx     Social History   Socioeconomic History   Marital status: Married    Spouse name: Not on file   Number of children: 1   Years of education: 15   Highest education level: Not on file  Occupational History   Occupation: Psychologist, counselling    Comment: assisted living  Tobacco Use   Smoking status: Never   Smokeless tobacco: Never  Vaping Use   Vaping Use: Never used  Substance and Sexual Activity   Alcohol use: Yes    Comment: ocassionally   Drug use: No   Sexual activity: Yes    Birth control/protection: I.U.D.  Other Topics Concern   Not on file  Social History Narrative   Not on file   Social Determinants of Health   Financial Resource Strain: Not on file  Food Insecurity: Not on file  Transportation Needs: Not on file  Physical Activity: Not on file  Stress: Not on file  Social Connections: Not on file  Intimate Partner Violence: Not on file     Review of Systems  Constitutional: Negative.   HENT: Negative.    Eyes: Negative.   Respiratory: Negative.    Cardiovascular: Negative.   Gastrointestinal: Negative.   Genitourinary: Negative.   Musculoskeletal: Negative.   Skin: Negative.   Neurological: Negative.   Psychiatric/Behavioral: Negative.      Physical Exam BP 120/80   Ht 5\' 5"  (1.651 m)   Wt 169 lb (76.7 kg)   LMP 01/25/2021 (Approximate)   BMI 28.12 kg/m   Physical Exam Constitutional:  General: She is not in acute distress.    Appearance: Normal appearance. She is well-developed.  Genitourinary:     Vulva and bladder normal.     Right Labia: No rash, tenderness, lesions, skin changes or Bartholin's cyst.    Left Labia: No tenderness, lesions, skin changes, Bartholin's cyst or rash.    No inguinal adenopathy present in the right or left side.    Pelvic Tanner Score: 5/5.    No vaginal discharge, erythema, tenderness or bleeding.      Right Adnexa: not tender, not full and no mass present.    Left Adnexa: not tender, not full and no mass present.    No cervical motion tenderness, discharge, lesion or polyp.     IUD strings visualized.     Uterus is not enlarged or tender.     No uterine mass detected.    Pelvic exam was performed with patient in the lithotomy position.  Breasts:    Right: No inverted nipple, mass, nipple discharge, skin change or tenderness.     Left: No inverted nipple, mass, nipple discharge, skin change or tenderness.  HENT:     Head: Normocephalic and atraumatic.  Eyes:     General: No scleral icterus.    Conjunctiva/sclera: Conjunctivae normal.  Neck:     Thyroid: No thyromegaly.  Cardiovascular:     Rate and Rhythm: Normal rate and regular rhythm.     Heart sounds: No murmur heard.   No friction rub. No gallop.  Pulmonary:     Effort: Pulmonary effort is normal. No respiratory distress.     Breath sounds: Normal breath sounds. No wheezing or rales.  Abdominal:      General: Bowel sounds are normal. There is no distension.     Palpations: Abdomen is soft. There is no mass.     Tenderness: There is no abdominal tenderness. There is no guarding or rebound.     Hernia: There is no hernia in the left inguinal area or right inguinal area.  Musculoskeletal:        General: No swelling or tenderness. Normal range of motion.     Cervical back: Normal range of motion and neck supple.  Lymphadenopathy:     Cervical: No cervical adenopathy.     Lower Body: No right inguinal adenopathy. No left inguinal adenopathy.  Neurological:     General: No focal deficit present.     Mental Status: She is alert and oriented to person, place, and time.     Cranial Nerves: No cranial nerve deficit.  Skin:    General: Skin is warm and dry.     Findings: No erythema or rash.  Psychiatric:        Mood and Affect: Mood normal.        Behavior: Behavior normal.        Judgment: Judgment normal.    Female chaperone present for pelvic and breast  portions of the physical exam  Results: AUDIT Questionnaire (screen for alcoholism): 1 PHQ-9: 3  Assessment: 50 y.o. G38P1001 female here for routine gynecologic examination.  Plan: Problem List Items Addressed This Visit       Other   Atypical glandular cells of undetermined significance (AGUS) on cervical Pap smear   Relevant Orders   Cytology - PAP   Other Visit Diagnoses     Women's annual routine gynecological examination    -  Primary   Relevant Orders   Cytology - PAP   Screening for  depression       Screening for alcoholism       Menorrhagia with irregular cycle       Relevant Medications   medroxyPROGESTERone (PROVERA) 10 MG tablet       Screening: -- Blood pressure screen normal -- Colonoscopy - not due -- Mammogram - due - already scheduled at [facility] on 02/2021 -- Weight screening: normal -- Depression screening negative (PHQ-9) -- Nutrition: normal -- cholesterol screening: per PCP --  osteoporosis screening: not due -- tobacco screening: not using -- alcohol screening: AUDIT questionnaire indicates low-risk usage. -- family history of breast cancer screening: done. not at high risk. -- no evidence of domestic violence or intimate partner violence. -- STD screening: gonorrhea/chlamydia NAAT not collected per patient request. -- pap smear collected per ASCCP guidelines -- flu vaccine  declined -- HPV vaccination series: not eligilbe  Menorrhagia: continue provera tablets.   Thomasene Mohair, MD 02/25/2021 4:35 PM

## 2021-03-02 LAB — CYTOLOGY - PAP
Comment: NEGATIVE
Diagnosis: NEGATIVE
High risk HPV: NEGATIVE

## 2021-03-12 ENCOUNTER — Ambulatory Visit
Admission: RE | Admit: 2021-03-12 | Discharge: 2021-03-12 | Disposition: A | Discharge status: Home / Self Care | Payer: 59 | Source: Ambulatory Visit | Attending: Obstetrics and Gynecology | Admitting: Obstetrics and Gynecology

## 2021-03-12 ENCOUNTER — Other Ambulatory Visit: Payer: Self-pay

## 2021-03-12 DIAGNOSIS — Z1231 Encounter for screening mammogram for malignant neoplasm of breast: Secondary | ICD-10-CM | POA: Diagnosis present

## 2021-10-27 ENCOUNTER — Other Ambulatory Visit: Payer: Self-pay | Admitting: Internal Medicine

## 2021-10-27 DIAGNOSIS — Z1231 Encounter for screening mammogram for malignant neoplasm of breast: Secondary | ICD-10-CM

## 2022-03-15 ENCOUNTER — Ambulatory Visit
Admission: RE | Admit: 2022-03-15 | Discharge: 2022-03-15 | Disposition: A | Discharge status: Home / Self Care | Payer: 59 | Source: Ambulatory Visit | Attending: Internal Medicine | Admitting: Internal Medicine

## 2022-03-15 DIAGNOSIS — Z1231 Encounter for screening mammogram for malignant neoplasm of breast: Secondary | ICD-10-CM | POA: Insufficient documentation

## 2022-10-31 ENCOUNTER — Other Ambulatory Visit: Payer: Self-pay | Admitting: Obstetrics and Gynecology

## 2022-10-31 DIAGNOSIS — Z1231 Encounter for screening mammogram for malignant neoplasm of breast: Secondary | ICD-10-CM

## 2023-02-16 IMAGING — MG MM DIGITAL SCREENING BILAT W/ TOMO AND CAD
6 of 10 series · 6 of 30 positions shown · non-contrast
Comparison: Previous exam(s).

CLINICAL DATA: Screening.

EXAM:
DIGITAL SCREENING BILATERAL MAMMOGRAM WITH TOMOSYNTHESIS AND CAD
TECHNIQUE: Bilateral screening digital craniocaudal and mediolateral oblique
mammograms were obtained. Bilateral screening digital breast
tomosynthesis was performed. The images were evaluated with
computer-aided detection.

[R CC synth-2D]
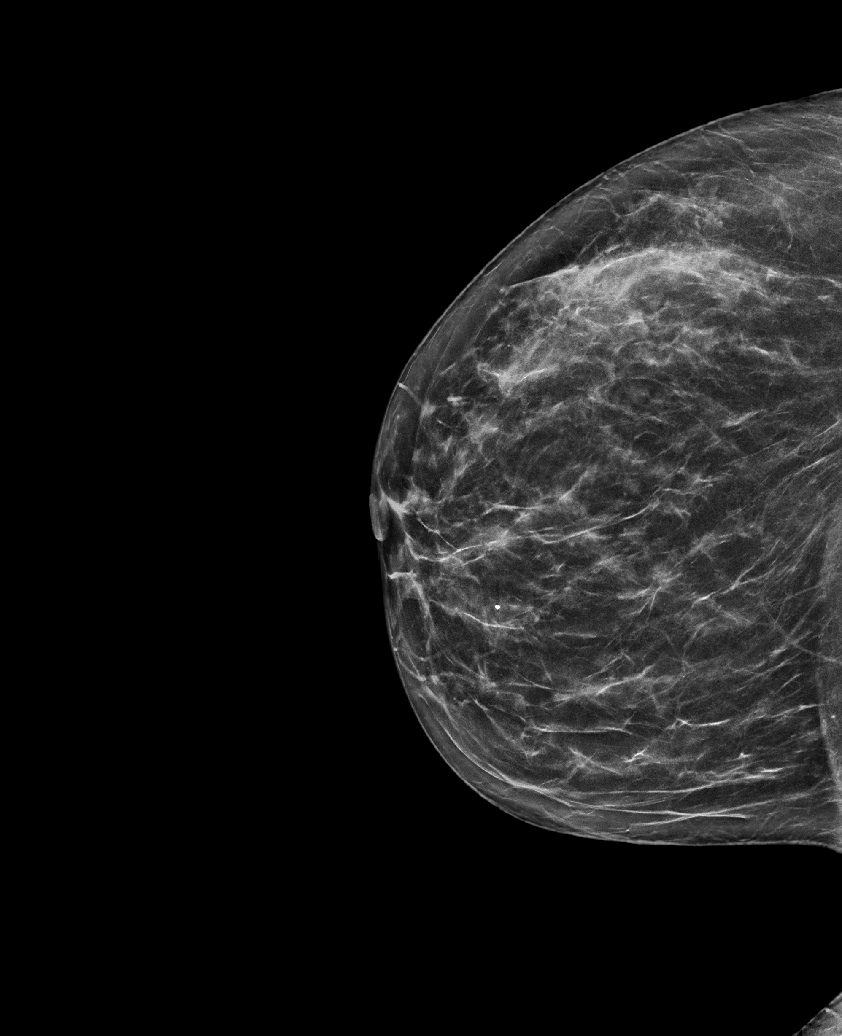

[L MLO synth-2D]
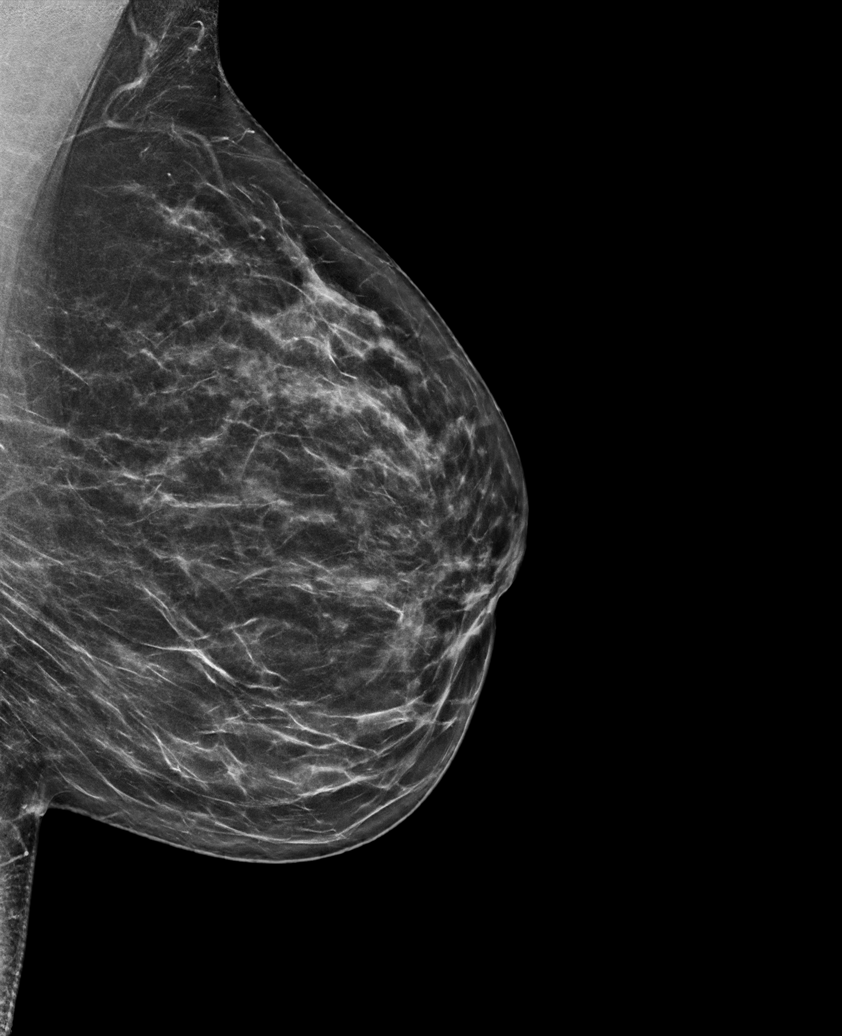

[R MLO synth-2D (1 of 2)]
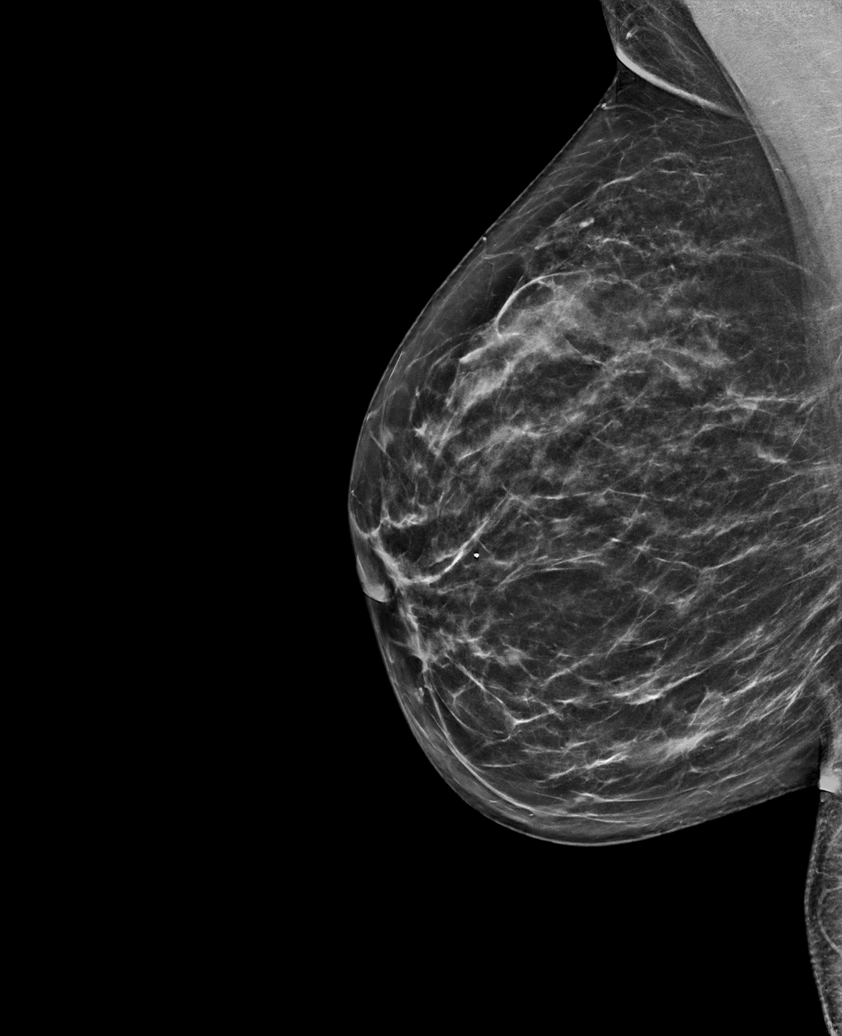

[R MLO synth-2D (2 of 2)]
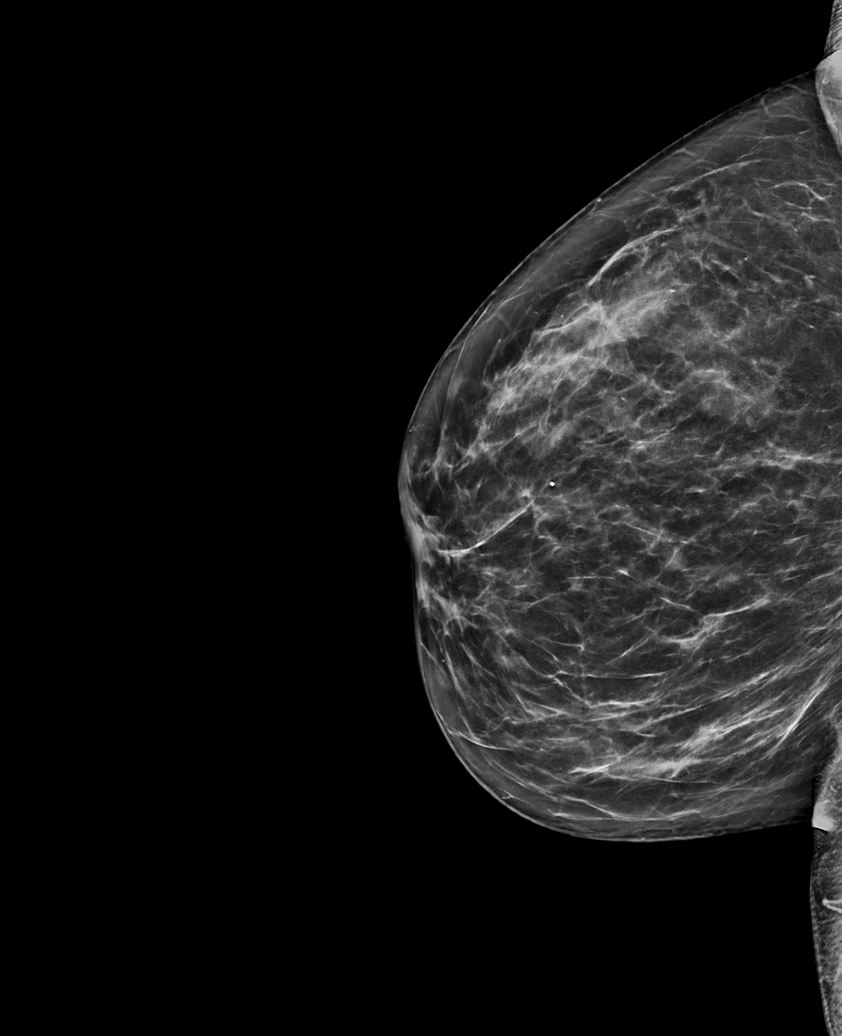

[L CC synth-2D]
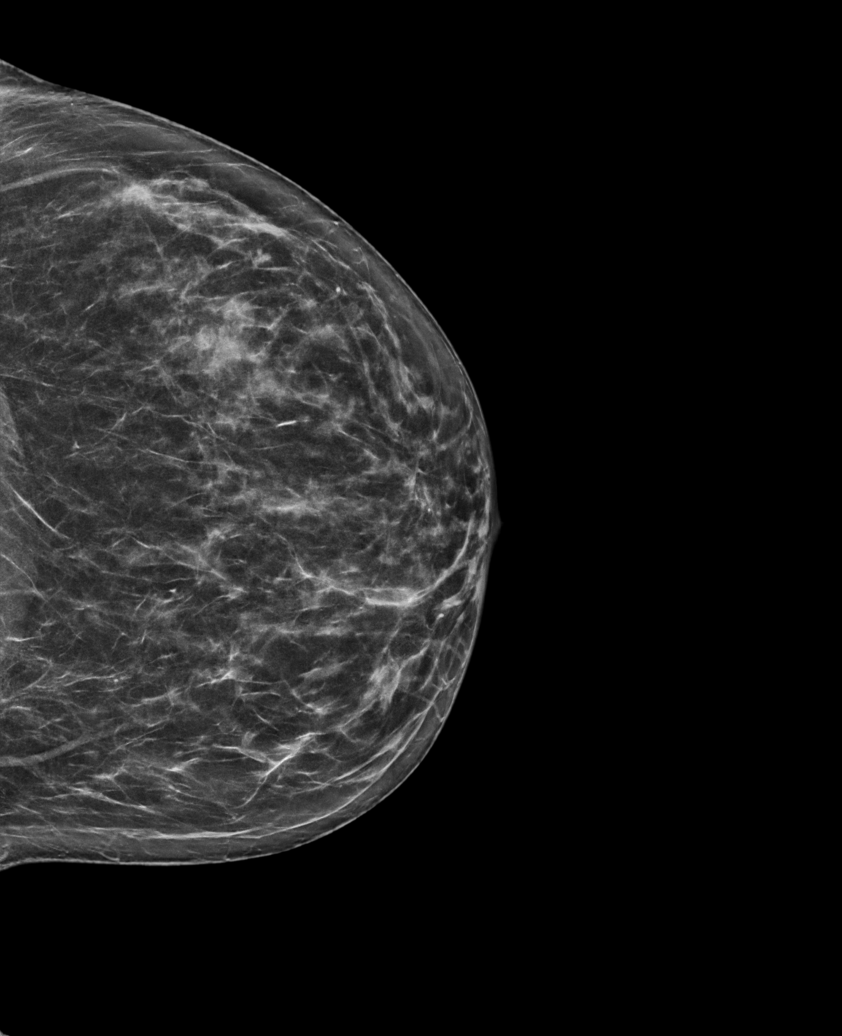

[L CC tomo · tomo slice 35/69.0]
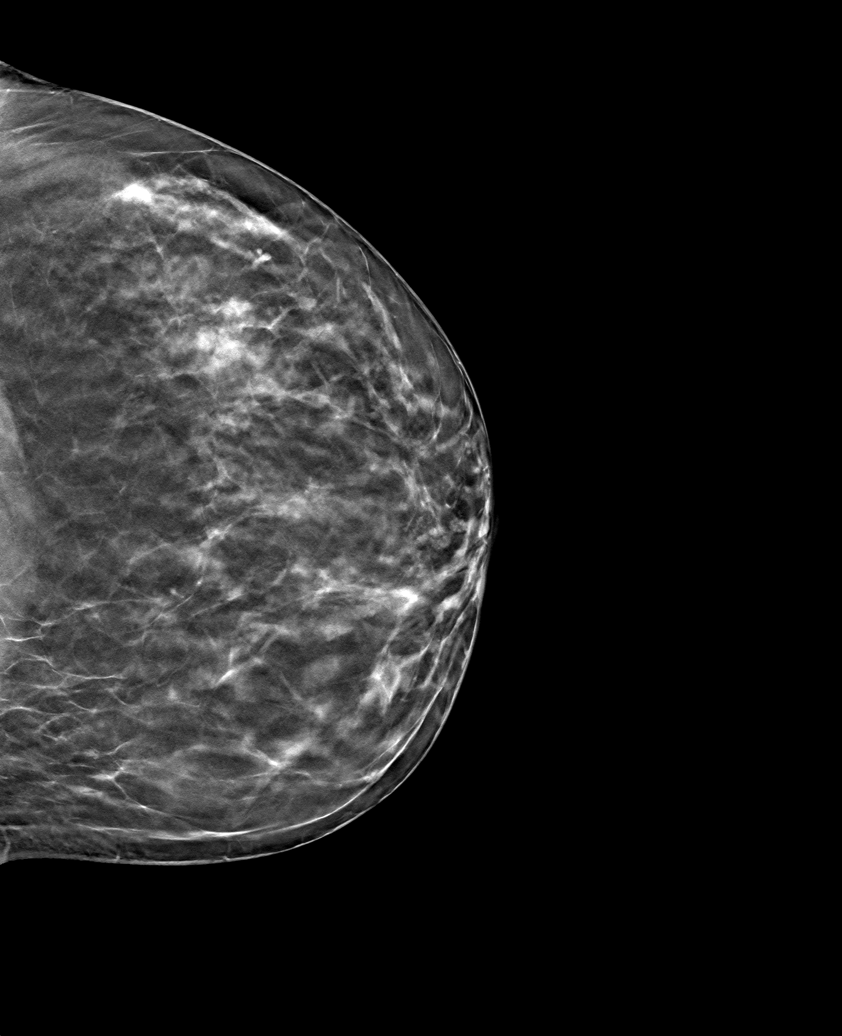

[6 of 30 positions shown; findings below may reference images not displayed]

ACR Breast Density Category c: The breast tissue is heterogeneously
dense, which may obscure small masses.
FINDINGS: There are no findings suspicious for malignancy.
IMPRESSION: No mammographic evidence of malignancy. A result letter of this
screening mammogram will be mailed directly to the patient.

RECOMMENDATION:
Screening mammogram in one year. (Code:Q3-W-BC3)

BI-RADS CATEGORY  1: Negative.

## 2023-03-17 ENCOUNTER — Encounter: Payer: Self-pay | Admitting: Radiology

## 2023-03-17 ENCOUNTER — Ambulatory Visit
Admission: RE | Admit: 2023-03-17 | Discharge: 2023-03-17 | Disposition: A | Discharge status: Home / Self Care | Payer: 59 | Source: Ambulatory Visit | Attending: Obstetrics and Gynecology | Admitting: Obstetrics and Gynecology

## 2023-03-17 DIAGNOSIS — Z1231 Encounter for screening mammogram for malignant neoplasm of breast: Secondary | ICD-10-CM | POA: Insufficient documentation

## 2023-05-08 ENCOUNTER — Other Ambulatory Visit: Payer: Self-pay | Admitting: Obstetrics and Gynecology

## 2023-05-08 DIAGNOSIS — Z1231 Encounter for screening mammogram for malignant neoplasm of breast: Secondary | ICD-10-CM

## 2024-03-18 ENCOUNTER — Ambulatory Visit
Admission: RE | Admit: 2024-03-18 | Discharge: 2024-03-18 | Disposition: A | Discharge status: Home / Self Care | Source: Ambulatory Visit | Attending: Obstetrics and Gynecology | Admitting: Obstetrics and Gynecology

## 2024-03-18 DIAGNOSIS — Z1231 Encounter for screening mammogram for malignant neoplasm of breast: Secondary | ICD-10-CM | POA: Diagnosis present
